# Patient Record
Sex: Female | Born: 1975 | Race: White | Hispanic: No | Marital: Married | Smoking: Never smoker
Health system: Southern US, Community
[De-identification: ages and names within clinical notes are randomized; demographics above are authoritative.]

## PROBLEM LIST (undated history)

## (undated) DIAGNOSIS — C4490 Unspecified malignant neoplasm of skin, unspecified: Secondary | ICD-10-CM

## (undated) DIAGNOSIS — T7840XA Allergy, unspecified, initial encounter: Secondary | ICD-10-CM

## (undated) DIAGNOSIS — N809 Endometriosis, unspecified: Secondary | ICD-10-CM

## (undated) DIAGNOSIS — J301 Allergic rhinitis due to pollen: Secondary | ICD-10-CM

## (undated) DIAGNOSIS — J45909 Unspecified asthma, uncomplicated: Secondary | ICD-10-CM

## (undated) DIAGNOSIS — F900 Attention-deficit hyperactivity disorder, predominantly inattentive type: Secondary | ICD-10-CM

## (undated) HISTORY — DX: Attention-deficit hyperactivity disorder, predominantly inattentive type: F90.0

## (undated) HISTORY — DX: Unspecified malignant neoplasm of skin, unspecified: C44.90

## (undated) HISTORY — DX: Endometriosis, unspecified: N80.9

## (undated) HISTORY — PX: OVARIAN CYST REMOVAL: SHX89

## (undated) HISTORY — DX: Allergic rhinitis due to pollen: J30.1

## (undated) HISTORY — PX: LAPAROSCOPIC OVARIAN CYSTECTOMY: SUR786

## (undated) HISTORY — DX: Allergy, unspecified, initial encounter: T78.40XA

---

## 1997-04-30 ENCOUNTER — Other Ambulatory Visit: Admission: RE | Admit: 1997-04-30 | Discharge: 1997-04-30 | Payer: Self-pay | Admitting: Obstetrics and Gynecology

## 1997-06-07 ENCOUNTER — Ambulatory Visit (HOSPITAL_COMMUNITY): Admission: RE | Admit: 1997-06-07 | Discharge: 1997-06-07 | Payer: Self-pay | Admitting: Obstetrics and Gynecology

## 1998-03-14 ENCOUNTER — Other Ambulatory Visit: Admission: RE | Admit: 1998-03-14 | Discharge: 1998-03-14 | Payer: Self-pay | Admitting: Obstetrics and Gynecology

## 1998-05-30 ENCOUNTER — Emergency Department (HOSPITAL_COMMUNITY): Admission: EM | Admit: 1998-05-30 | Discharge: 1998-05-30 | Payer: Self-pay | Admitting: Emergency Medicine

## 1999-06-14 ENCOUNTER — Inpatient Hospital Stay (HOSPITAL_COMMUNITY): Admission: AD | Admit: 1999-06-14 | Discharge: 1999-06-14 | Payer: Self-pay | Admitting: Obstetrics and Gynecology

## 1999-06-15 ENCOUNTER — Inpatient Hospital Stay (HOSPITAL_COMMUNITY): Admission: AD | Admit: 1999-06-15 | Discharge: 1999-06-15 | Payer: Self-pay | Admitting: Obstetrics and Gynecology

## 1999-06-15 ENCOUNTER — Inpatient Hospital Stay (HOSPITAL_COMMUNITY): Admission: AD | Admit: 1999-06-15 | Discharge: 1999-06-17 | Payer: Self-pay | Admitting: Obstetrics and Gynecology

## 1999-06-23 ENCOUNTER — Encounter: Admission: RE | Admit: 1999-06-23 | Discharge: 1999-09-21 | Payer: Self-pay | Admitting: Obstetrics and Gynecology

## 1999-07-21 ENCOUNTER — Other Ambulatory Visit: Admission: RE | Admit: 1999-07-21 | Discharge: 1999-07-21 | Payer: Self-pay | Admitting: Obstetrics and Gynecology

## 2000-08-22 ENCOUNTER — Other Ambulatory Visit: Admission: RE | Admit: 2000-08-22 | Discharge: 2000-08-22 | Payer: Self-pay | Admitting: Obstetrics and Gynecology

## 2001-09-05 ENCOUNTER — Other Ambulatory Visit: Admission: RE | Admit: 2001-09-05 | Discharge: 2001-09-05 | Payer: Self-pay | Admitting: Obstetrics and Gynecology

## 2002-09-12 ENCOUNTER — Encounter: Payer: Self-pay | Admitting: Obstetrics and Gynecology

## 2002-09-12 ENCOUNTER — Encounter: Admission: RE | Admit: 2002-09-12 | Discharge: 2002-09-12 | Payer: Self-pay | Admitting: Obstetrics and Gynecology

## 2002-09-12 ENCOUNTER — Other Ambulatory Visit: Admission: RE | Admit: 2002-09-12 | Discharge: 2002-09-12 | Payer: Self-pay | Admitting: Obstetrics and Gynecology

## 2003-03-22 ENCOUNTER — Emergency Department (HOSPITAL_COMMUNITY): Admission: EM | Admit: 2003-03-22 | Discharge: 2003-03-22 | Payer: Self-pay | Admitting: Family Medicine

## 2003-09-13 ENCOUNTER — Other Ambulatory Visit: Admission: RE | Admit: 2003-09-13 | Discharge: 2003-09-13 | Payer: Self-pay | Admitting: Obstetrics and Gynecology

## 2003-10-29 ENCOUNTER — Emergency Department (HOSPITAL_COMMUNITY): Admission: EM | Admit: 2003-10-29 | Discharge: 2003-10-30 | Payer: Self-pay | Admitting: *Deleted

## 2004-01-10 ENCOUNTER — Emergency Department (HOSPITAL_COMMUNITY): Admission: EM | Admit: 2004-01-10 | Discharge: 2004-01-10 | Payer: Self-pay | Admitting: Family Medicine

## 2004-01-16 ENCOUNTER — Ambulatory Visit: Payer: Self-pay | Admitting: Family Medicine

## 2004-09-07 ENCOUNTER — Other Ambulatory Visit: Admission: RE | Admit: 2004-09-07 | Discharge: 2004-09-07 | Payer: Self-pay | Admitting: Obstetrics and Gynecology

## 2005-04-05 ENCOUNTER — Ambulatory Visit (HOSPITAL_COMMUNITY): Admission: RE | Admit: 2005-04-05 | Discharge: 2005-04-05 | Payer: Self-pay | Admitting: Gastroenterology

## 2005-04-05 ENCOUNTER — Encounter (INDEPENDENT_AMBULATORY_CARE_PROVIDER_SITE_OTHER): Payer: Self-pay | Admitting: Specialist

## 2005-04-26 ENCOUNTER — Encounter: Admission: RE | Admit: 2005-04-26 | Discharge: 2005-04-26 | Payer: Self-pay | Admitting: Gastroenterology

## 2005-10-28 ENCOUNTER — Emergency Department (HOSPITAL_COMMUNITY): Admission: EM | Admit: 2005-10-28 | Discharge: 2005-10-28 | Payer: Self-pay | Admitting: Family Medicine

## 2006-05-10 ENCOUNTER — Ambulatory Visit: Payer: Self-pay | Admitting: Family Medicine

## 2006-05-10 LAB — CONVERTED CEMR LAB
ALT: 15 units/L (ref 0–40)
AST: 22 units/L (ref 0–37)
Albumin: 4.1 g/dL (ref 3.5–5.2)
Alkaline Phosphatase: 46 units/L (ref 39–117)
Amylase: 71 units/L (ref 27–131)
BUN: 12 mg/dL (ref 6–23)
Basophils Absolute: 0 10*3/uL (ref 0.0–0.1)
Basophils Relative: 0.4 % (ref 0.0–1.0)
Bilirubin, Direct: 0.1 mg/dL (ref 0.0–0.3)
CO2: 32 meq/L (ref 19–32)
Calcium: 9.8 mg/dL (ref 8.4–10.5)
Chloride: 106 meq/L (ref 96–112)
Creatinine, Ser: 0.7 mg/dL (ref 0.4–1.2)
Eosinophils Absolute: 0.1 10*3/uL (ref 0.0–0.6)
Eosinophils Relative: 1 % (ref 0.0–5.0)
Folate: 15.8 ng/mL
GFR calc Af Amer: 125 mL/min
GFR calc non Af Amer: 103 mL/min
Glucose, Bld: 87 mg/dL (ref 70–99)
HCT: 37.7 % (ref 36.0–46.0)
Hemoglobin: 12.8 g/dL (ref 12.0–15.0)
Lymphocytes Relative: 28.6 % (ref 12.0–46.0)
MCHC: 34 g/dL (ref 30.0–36.0)
MCV: 85.9 fL (ref 78.0–100.0)
Monocytes Absolute: 0.6 10*3/uL (ref 0.2–0.7)
Monocytes Relative: 11.2 % — ABNORMAL HIGH (ref 3.0–11.0)
Neutro Abs: 3.4 10*3/uL (ref 1.4–7.7)
Neutrophils Relative %: 58.8 % (ref 43.0–77.0)
Platelets: 247 10*3/uL (ref 150–400)
Potassium: 4.6 meq/L (ref 3.5–5.1)
RBC: 4.39 M/uL (ref 3.87–5.11)
RDW: 12.4 % (ref 11.5–14.6)
Sodium: 142 meq/L (ref 135–145)
TSH: 2.17 microintl units/mL (ref 0.35–5.50)
Total Bilirubin: 0.8 mg/dL (ref 0.3–1.2)
Total Protein: 7.4 g/dL (ref 6.0–8.3)
WBC: 5.7 10*3/uL (ref 4.5–10.5)

## 2009-01-11 DIAGNOSIS — L439 Lichen planus, unspecified: Secondary | ICD-10-CM

## 2009-01-11 HISTORY — DX: Lichen planus, unspecified: L43.9

## 2009-07-16 ENCOUNTER — Emergency Department (HOSPITAL_COMMUNITY): Admission: EM | Admit: 2009-07-16 | Discharge: 2009-07-16 | Payer: Self-pay | Admitting: Emergency Medicine

## 2010-05-29 NOTE — Op Note (Signed)
[name], Maureen Velazquez              ACCOUNT NO.:  [id number]   MEDICAL RECORD NO.:  [id number]          PATIENT TYPE:  AMB   LOCATION:  ENDO                         FACILITY:  [facility]   PHYSICIAN:  Anselmo Rod, M.D.  DATE OF BIRTH:  February 04, 1975   DATE OF PROCEDURE:  04/06/2005  DATE OF DISCHARGE:                                 OPERATIVE REPORT   PROCEDURE:  Colonoscopy with multiple cold biopsies.   ENDOSCOPIST:  Anselmo Rod, M.D.   INSTRUMENT USED:  Olympus video colonoscope.   INDICATIONS FOR PROCEDURE:  35 year old white female with a history of  change in bowel habits, worsening constipation, family history of colon  cancer in a maternal grandfather and maternal grandmother.  Rule out colonic  polyps, masses, etc.   PREPROCEDURE PREPARATION:  Informed consent was obtained from the patient.  The patient was fasted for four hours prior to the procedure and prepped  with Osmo prep pills the night of and the morning of the procedure.  The  risks and benefits of the procedure including a 10% miss rate of cancer and  polyps were discussed with the patient, as well.   PREPROCEDURE PHYSICAL:  Patient with stable vital signs.  Neck supple.  Chest clear to auscultation.  S1 and S2 regular.  Abdomen soft with normal  bowel sounds.   DESCRIPTION OF PROCEDURE:  The patient was placed in the left lateral  decubitus position, sedated with an additional 30 mcg of Fentanyl and 3 mg  Versed in slow incremental doses.  Once the patient was adequately sedated,  maintained on low flow oxygen and continuous cardiac monitoring, the Olympus  video colonoscope was advanced from the rectum to the cecum.  A prominent  fold was biopsied from the cecum.  No masses or polyps were seen.  The  terminal ileum appeared healthy without lesions.  Small internal hemorrhoids  were seen on retroflexion in the rectum.  The rest of the exam was  unremarkable.  The patient tolerated the procedure well  without  complications.   IMPRESSION:  1.  Small nonbleeding internal hemorrhoids.  2.  Prominent fold biopsy from the cecum.  3.  Otherwise, normal exam to the terminal ileum.   RECOMMENDATIONS:  1.  Await pathology results.  2.  Avoid all nonsteroidals for now.  3.  Outpatient follow up in the next two weeks for further recommendations.      Anselmo Rod, M.D.  Electronically Signed     JNM/MEDQ  D:  04/06/2005  T:  04/07/2005  Job:  [id number]   cc:   Marcelino Duster L. Vincente Poli, M.D.  Fax: [phone]

## 2010-05-29 NOTE — Assessment & Plan Note (Signed)
[facility] OFFICE NOTE   Maureen Velazquez, Maureen Velazquez                       MRN:          [id number]  DATE:05/10/2006                            DOB:          May 11, 1975    This is a 35 year old woman here to establish with our practice  complaining of an area of irritation and numbness in her back.  This  area is confined to a very specific area on the left side of the center  of her back.  It began bothering her about a year and a half ago for no  particular reason.  She has no history of rashes or anything in that  area that she knows of.  She does note an episode of shingles on her  right flank which resolved quickly about six years ago.  There has been  no history of trauma to the back.  She denies that it is actually  painful but says that it is a little irritated, sometimes has a burning  quality, and sometimes has a numbness to it with decreased sense of  touch.  She had been going to urgent care doctors and was referred to  see Dr. Lesia Sago for neurologic consult about four months ago.  He  did an MRI scan of part of her spine, which I assume was the thoracic  spine.  Apparently this was normal.  He tried her on nonsteroidal anti-  inflammatory medications for a while but none of these seemed to help.  The patient saw a chiropractor for a while and these manipulations did  not help either.  The pain is a constant irritation to her, although it  does not limit her daily activities.  She typically has flare-ups of  this problem every day but it may come and go at various times  throughout the day.  She does know there are certain ways she can arch  her back or twist from side-to-side where she feels a little twinge of  pain in the back or she may be able to start it bothering her when it  had not been bothering her before that during that day.  There is no  shortness of breath, cough, or fever.  She notes about  10 years ago she  fell off a horse and briefly experienced some numbness and weakness with  inability to move her legs.  She said after about 5 minute she regained  total use of her legs and has had no problems with the lower extremities  ever since.   OTHER PAST MEDICAL HISTORY:  She has had two vaginal deliveries.  Her  children are ages 92 and 6.  She sees Dr. Aaron Edelman for OB/GYN  care.  She is having some abdominal discomfort and underwent an upper  and lower endoscopy in March 2007 by Dr. Anselmo Rod.  The upper  endoscopy revealed a hiatal hernia and some reflux and the colonoscopy  was clear.  She had a heart murmur for a while as a baby but grew out of  it.  She  has some allergies.  She did have chicken pox as a child.  She  had some asthma as a child but it rarely bothers her now.  She had an  ovarian cystectomy in April 2007.   ALLERGIES:  None.   CURRENT MEDICATIONS:  Allegra D b.i.d. as needed.  Albuterol inhaler b.i.d. as needed.   HABITS:  She does not use tobacco.  She drinks some wine.   SOCIAL HISTORY:  She is married.  She is a Associate Professor.  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   FAMILY HISTORY:  Remarkable for colon cancer in two of her parents.   OBJECTIVE:  VITAL SIGNS:  Height 5 feet 0 inches.  Weight 118.  Blood  pressure 94/72.  Pulse 84 and regular.  Temperature 98.4. degrees.  IN GENERAL:  She is in no acute distress.  She gets up and down from the  examination table easily.  SPINE:  Shows no tenderness.  Her back itself shows no particular  tenderness.  No skin lesions are seen.  She has full range of motion of  the thoracic spine through flexion, extension, and lateral rotation.  NEUROLOGIC EXAM:  Otherwise, grossly intact.   ASSESSMENT AND PLAN:  Neuropathy of uncertain etiology.  We will try to  get Dr. Clarisa Kindred office notes as well as the results of the MRI scans  sent  to [place] that she recently had done.  Secondly, we will get some  screening laboratories today including a glucose, TSH, CBC, B12 level,  etc.  Lastly will begin Neurontin 100 mg b.i.d. and titrate it upwards  as needed.     Tera Mater. Clent Ridges, MD  Electronically Signed    SAF/MedQ  DD: 05/10/2006  DT: 05/10/2006  Job #: [id number]

## 2010-05-29 NOTE — Op Note (Signed)
NAMECHEZNEY, Maureen Velazquez              ACCOUNT NO.:  [id number]   MEDICAL RECORD NO.:  [id number]          PATIENT TYPE:  AMB   LOCATION:  ENDO                         FACILITY:  [facility]   PHYSICIAN:  Anselmo Rod, M.D.  DATE OF BIRTH:  03-19-1975   DATE OF PROCEDURE:  04/05/2005  DATE OF DISCHARGE:                                 OPERATIVE REPORT   PROCEDURE:  Esophagogastroduodenoscopy with gastric biopsy.   ENDOSCOPIST:  Anselmo Rod, M.D.   INSTRUMENT USED:  Olympus video panendoscope.   INDICATIONS FOR PROCEDURE:  35 year old white female with a history of  epigastric pain, rule out peptic ulcer disease, esophagitis, gastritis, etc.   PREPROCEDURE PREPARATION:  Informed consent was obtained from the patient.  The patient was fasted for four hours prior to the procedure.  The risks and  benefits of the procedure were discussed with the patient in great detail.   PREPROCEDURE PHYSICAL:  Patient with stable vital signs.  Neck supple.  Chest clear to auscultation.  S1 and S2 regular.  Abdomen soft with normal  bowel sounds.   DESCRIPTION OF PROCEDURE:  The patient was placed in the left lateral  decubitus position, sedated with 70 mcg of fentanyl and 7 mg Versed in slow  incremental doses.  Once the patient was adequately sedated, maintained on  low flow oxygen and continuous cardiac monitoring, the Olympus video  panendoscope was advanced through the mouth piece over the tongue into the  esophagus under direct vision.  The entire esophagus appeared normal with no  evidence of ring, strictures, masses, esophagitis, or Barrett's mucosa.  The  scope was then advanced into the stomach.  A small hiatal hernia was seen on  high retroflexion and there was mild diffuse gastritis, gastric biopsy was  done to rule out the presence of H. pylori by pathology.  The proximal small  bowel appeared normal.  No ulcers, erosions, masses, or polyps were seen.   IMPRESSION:  1.  Normal  appearing esophagus.  2.  Small hiatal hernia seen on retroflexion.  3.  Mild, diffuse gastritis with biopsies done for H. pylori, no ulcers,      erosions, masses, or polyps were seen.  4.  Normal proximal small bowel.   RECOMMENDATIONS:  1.  Continue PPIs.  2.  Avoid all nonsteroidals for now.  3.  Await pathology results.  4.  Treat with antibiotics if H. pylori present on biopsy.  5.  Proceed with colonoscopy at this time, further recommendations will be      made after this.      Anselmo Rod, M.D.  Electronically Signed     JNM/MEDQ  D:  04/06/2005  T:  04/07/2005  Job:  [id number]   cc:   Marcelino Duster L. Vincente Poli, M.D.  Fax: [phone]

## 2011-11-29 ENCOUNTER — Other Ambulatory Visit: Payer: Self-pay | Admitting: Obstetrics and Gynecology

## 2012-08-28 ENCOUNTER — Ambulatory Visit (INDEPENDENT_AMBULATORY_CARE_PROVIDER_SITE_OTHER): Payer: 59 | Admitting: Emergency Medicine

## 2012-08-28 VITALS — BP 98/60 | HR 81 | Temp 98.2°F | Resp 18 | Ht 61.5 in | Wt 118.0 lb

## 2012-08-28 DIAGNOSIS — F988 Other specified behavioral and emotional disorders with onset usually occurring in childhood and adolescence: Secondary | ICD-10-CM

## 2012-08-28 MED ORDER — LISDEXAMFETAMINE DIMESYLATE 30 MG PO CAPS: 30.0000 mg | ORAL_CAPSULE | ORAL | Status: DC

## 2012-08-28 NOTE — Patient Instructions (Addendum)
Attention Deficit Hyperactivity Disorder Attention deficit hyperactivity disorder (ADHD) is a problem with behavior issues based on the way the brain functions (neurobehavioral disorder). It is a common reason for behavior and academic problems in school. CAUSES  The cause of ADHD is unknown in most cases. It may run in families. It sometimes can be associated with learning disabilities and other behavioral problems. SYMPTOMS  There are 3 types of ADHD. The 3 types and some of the symptoms include:  Inattentive  Gets bored or distracted easily.  Loses or forgets things. Forgets to hand in homework.  Has trouble organizing or completing tasks.  Difficulty staying on task.  An inability to organize daily tasks and school work.  Leaving projects, chores, or homework unfinished.  Trouble paying attention or responding to details. Careless mistakes.  Difficulty following directions. Often seems like is not listening.  Dislikes activities that require sustained attention (like chores or homework).  Hyperactive-impulsive  Feels like it is impossible to sit still or stay in a seat. Fidgeting with hands and feet.  Trouble waiting turn.  Talking too much or out of turn. Interruptive.  Speaks or acts impulsively.  Aggressive, disruptive behavior.  Constantly busy or on the go, noisy.  Combined  Has symptoms of both of the above. Often children with ADHD feel discouraged about themselves and with school. They often perform well below their abilities in school. These symptoms can cause problems in home, school, and in relationships with peers. As children get older, the excess motor activities can calm down, but the problems with paying attention and staying organized persist. Most children do not outgrow ADHD but with good treatment can learn to cope with the symptoms. DIAGNOSIS  When ADHD is suspected, the diagnosis should be made by professionals trained in ADHD.  Diagnosis will  include:  Ruling out other reasons for the child's behavior.  The caregivers will check with the child's school and check their medical records.  They will talk to teachers and parents.  Behavior rating scales for the child will be filled out by those dealing with the child on a daily basis. A diagnosis is made only after all information has been considered. TREATMENT  Treatment usually includes behavioral treatment often along with medicines. It may include stimulant medicines. The stimulant medicines decrease impulsivity and hyperactivity and increase attention. Other medicines used include antidepressants and certain blood pressure medicines. Most experts agree that treatment for ADHD should address all aspects of the child's functioning. Treatment should not be limited to the use of medicines alone. Treatment should include structured classroom management. The parents must receive education to address rewarding good behavior, discipline, and limit-setting. Tutoring or behavioral therapy or both should be available for the child. If untreated, the disorder can have long-term serious effects into adolescence and adulthood. HOME CARE INSTRUCTIONS   Often with ADHD there is a lot of frustration among the family in dealing with the illness. There is often blame and anger that is not warranted. This is a life long illness. There is no way to prevent ADHD. In many cases, because the problem affects the family as a whole, the entire family may need help. A therapist can help the family find better ways to handle the disruptive behaviors and promote change. If the child is young, most of the therapist's work is with the parents. Parents will learn techniques for coping with and improving their child's behavior. Sometimes only the child with the ADHD needs counseling. Your caregivers can help   you make these decisions.  Children with ADHD may need help in organizing. Some helpful tips include:  Keep  routines the same every day from wake-up time to bedtime. Schedule everything. This includes homework and playtime. This should include outdoor and indoor recreation. Keep the schedule on the refrigerator or a bulletin board where it is frequently seen. Mark schedule changes as far in advance as possible.  Have a place for everything and keep everything in its place. This includes clothing, backpacks, and school supplies.  Encourage writing down assignments and bringing home needed books.  Offer your child a well-balanced diet. Breakfast is especially important for school performance. Children should avoid drinks with caffeine including:  Soft drinks.  Coffee.  Tea.  However, some older children (adolescents) may find these drinks helpful in improving their attention.  Children with ADHD need consistent rules that they can understand and follow. If rules are followed, give small rewards. Children with ADHD often receive, and expect, criticism. Look for good behavior and praise it. Set realistic goals. Give clear instructions. Look for activities that can foster success and self-esteem. Make time for pleasant activities with your child. Give lots of affection.  Parents are their children's greatest advocates. Learn as much as possible about ADHD. This helps you become a stronger and better advocate for your child. It also helps you educate your child's teachers and instructors if they feel inadequate in these areas. Parent support groups are often helpful. A national group with local chapters is called CHADD (Children and Adults with Attention Deficit Hyperactivity Disorder). PROGNOSIS  There is no cure for ADHD. Children with the disorder seldom outgrow it. Many find adaptive ways to accommodate the ADHD as they mature. SEEK MEDICAL CARE IF:  Your child has repeated muscle twitches, cough or speech outbursts.  Your child has sleep problems.  Your child has a marked loss of  appetite.  Your child develops depression.  Your child has new or worsening behavioral problems.  Your child develops dizziness.  Your child has a racing heart.  Your child has stomach pains.  Your child develops headaches. Document Released: 12/18/2001 Document Revised: 03/22/2011 Document Reviewed: 07/31/2007 ExitCare Patient Information 2014 [employer].  

## 2012-08-28 NOTE — Progress Notes (Signed)
[facility] [address], [place] [address] [phone]  Date:  08/28/2012   Name:  Maureen Velazquez   DOB:  Aug 03, 1975   MRN:  [id number]  PCP:  No primary provider on file.    Chief Complaint: discuss medications   History of Present Illness:  Maureen Velazquez is a 37 y.o. very pleasant female patient who presents with the following:  Noticed that she had the same issues that led to her children's treatment with ADD medication for the past month, she decided to do a trial of her son's medication on herself and found that she was more focused and "able to calm down" a little bit.  Works for her Public relations account executive business.  Tolerates the medication but for som mild sleep induction issues.  Weight and appetite are stable.  Brought a letter from her therapist who recommends she be continued on medication.  She has never been worked up for ADD.  No improvement with over the counter medications or other home remedies. Denies other complaint or health concern today.    There are no active problems to display for this patient.   Past Medical History  Diagnosis Date  . Allergy     Past Surgical History  Procedure Laterality Date  . Laparoscopic ovarian cystectomy      History  Substance Use Topics  . Smoking status: Never Smoker   . Smokeless tobacco: Not on file  . Alcohol Use: No    Family History  Problem Relation Age of Onset  . Cancer Maternal Grandmother   . Cancer Maternal Grandfather     No Known Allergies  Medication list has been reviewed and updated.  No current outpatient prescriptions on file prior to visit.   No current facility-administered medications on file prior to visit.    Review of Systems:  As per HPI, otherwise negative.    Physical Examination: Filed Vitals:   08/28/12 1123  BP: 98/60  Pulse: 81  Temp: 98.2 F (36.8 C)  Resp: 18   Filed Vitals:   08/28/12 1123  Height: 5' 1.5" (1.562 m)  Weight: 118  lb (53.524 kg)   Body mass index is 21.94 kg/(m^2). Ideal Body Weight: Weight in (lb) to have BMI = 25: 134.2  GEN: WDWN, NAD, Non-toxic, A & O x 3 HEENT: Atraumatic, Normocephalic. Neck supple. No masses, No LAD. Ears and Nose: No external deformity. CV: RRR, No M/G/R. No JVD. No thrill. No extra heart sounds. PULM: CTA B, no wheezes, crackles, rhonchi. No retractions. No resp. distress. No accessory muscle use. ABD: S, NT, ND, +BS. No rebound. No HSM. EXTR: No c/c/e NEURO Normal gait.  PSYCH: Normally interactive. Conversant. Not depressed or anxious appearing.  Calm demeanor.    Assessment and Plan: ADD Continue vyvanse Follow up with Dr Merla Riches    Signed,  Phillips Odor, MD

## 2012-09-22 ENCOUNTER — Encounter: Payer: Self-pay | Admitting: Family Medicine

## 2012-09-22 ENCOUNTER — Ambulatory Visit (INDEPENDENT_AMBULATORY_CARE_PROVIDER_SITE_OTHER): Payer: BC Managed Care – PPO | Admitting: Family Medicine

## 2012-09-22 VITALS — BP 98/68 | HR 64 | Temp 98.0°F | Resp 16 | Ht 61.0 in | Wt 117.0 lb

## 2012-09-22 DIAGNOSIS — F988 Other specified behavioral and emotional disorders with onset usually occurring in childhood and adolescence: Secondary | ICD-10-CM

## 2012-09-22 MED ORDER — LISDEXAMFETAMINE DIMESYLATE 30 MG PO CAPS: 30.0000 mg | ORAL_CAPSULE | ORAL | Status: DC

## 2012-09-22 NOTE — Patient Instructions (Addendum)
You need to get formal ADD/ADHD testing before I will continue you on stimulant therapy.  There are many places this can be done such as:   [facility] [address] [place] 981 [phone]   [facility] [address] [place]. [place] 200 [phone]  [facility] [address] [phone]  Kellie Moor  [place] [address] [place] [phone]  [facility] [address] [phone]  [facility]  It may be worth going to a location that has a PSYCHIATRIST - a MD (medial doctor) that can then prescribe your ADD stimulant therapy though if you are unable to do this, we can discuss prescribing your medications here.  For instance: [facility]  [address]  [address]  [phone] I think [facility] also has a psychiatrist.  Alternatively, there are several different ADD treatments that do NOT involve stimulants which are safer to sustain long-term.  I would be happy to discuss these with you should you be interested.

## 2012-09-22 NOTE — Progress Notes (Signed)
Subjective:    Patient ID: Maureen Velazquez, female    DOB: 04-21-1975, 37 y.o.   MRN: [id number] Chief Complaint  Patient presents with  . Medication Refill    vyvanse   HPI  As kids have gone through the process of ADD testing at [facility] she realized that she identified with a lot of the questions and concerns raised during their diagnosis. Her kids are 53 yo [place] and 64 yo Sheria Lang - Charlotte Sanes was diagnosed at 68 yo and brother diagnosed 86 yo. 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  Savanah tried adderall for less than a month but didn't do well on it and cameron hasn't tried Vyvanse yet though has a prescription for it so currently dealing with kids ADD through behavioral modification rather than medications.  Started on Vyvanse 2 months ago by Dr. Dareen Piano - see note. First mo was extremely tired in the mornings and has to exercise less - less than 1/2 of what she was doing prior for exercise and HAs which have gone away.  A.m. Fatigue is leveled out but not resolved. Feels like muscle fatigue.  Not falling asleep as well but then sleeping ok after eventually falling asleep.  Takes the medication around 7-8 a.m. and around 5-6 p.m. can feel the medicine wearing off again and gets tired again but usually by 9 p.m. is doing well.  Is taking the vyvanse every single day and has never tried any other med moods in the past - poss short-term lexapro during her divorce from her gyn but was not on it long enough to be therapeutic - "stopped after 1-2 months because it wasn't working". Gynecologist is [facility].  She will have her annual well woman exam in the next sev weeks. Reports that she often has her routine blood work done along with this and will have a copy forwarded to [place]. Therapist is Karmen Bongo at [facility] since divorce but more around children's issues.  Started on therapy after her divorce - the children's father will have nothing to do with them so  they occ see the therapist as well.  Past Medical History  Diagnosis Date  . Allergy    Current Outpatient Prescriptions on File Prior to Visit  Medication Sig Dispense Refill  . lisdexamfetamine (VYVANSE) 30 MG capsule Take 1 capsule (30 mg total) by mouth every morning. DO not fill prior to 10/28/12  30 capsule  0   No current facility-administered medications on file prior to visit.   No Known Allergies  Review of Systems  Constitutional: Positive for activity change and fatigue. Negative for fever, chills, diaphoresis, appetite change and unexpected weight change.  Cardiovascular: Negative for chest pain.  Gastrointestinal: Negative for nausea and vomiting.  Musculoskeletal: Positive for myalgias.  Psychiatric/Behavioral: Positive for sleep disturbance and decreased concentration. Negative for behavioral problems, confusion, dysphoric mood and agitation. The patient is not nervous/anxious and is not hyperactive.       BP 98/68  Pulse 64  Temp(Src) 98 F (36.7 C) (Oral)  Resp 16  Ht 5\' 1"  (1.549 m)  Wt 117 lb (53.071 kg)  BMI 22.12 kg/m2  SpO2 100%  LMP 09/18/2012 Objective:   Physical Exam  Constitutional: She is oriented to person, place, and time. She appears well-developed and well-nourished. No distress.  HENT:  Head: Normocephalic and atraumatic.  Right Ear: External ear normal.  Left Ear: External ear normal.  Eyes: Conjunctivae are normal. No scleral icterus.  Neck:  Normal range of motion. Neck supple. No thyromegaly present.  Cardiovascular: Normal rate, regular rhythm, normal heart sounds and intact distal pulses.   Pulmonary/Chest: Effort normal and breath sounds normal. No respiratory distress.  Musculoskeletal: She exhibits no edema.  Lymphadenopathy:    She has no cervical adenopathy.  Neurological: She is alert and oriented to person, place, and time.  Skin: Skin is warm and dry. She is not diaphoretic. No erythema.  Psychiatric: She has a normal mood  and affect. Her behavior is normal.      Assessment & Plan:  ADD (attention deficit disorder) Has rx from Dr. Dareen Piano from 8/18 and then second rx states do not fill till on or after 10/18 to gave 1 rx for this month - till fill on 9/18.  Info given for formal ADD testing which she will need to obtain before she is due for refills on 11/15 and will need to have OV at that time to review results and discuss long-term medication options.  Will need to be seen every 3 months if on stimulant therapy but recommend cons psychiatry visits for meds. Meds ordered this encounter  Medications  . lisdexamfetamine (VYVANSE) 30 MG capsule    Sig: Take 1 capsule (30 mg total) by mouth every morning.    Dispense:  30 capsule    Refill:  0   Have gynecologist send up copy of annual visit and labs.

## 2012-10-30 ENCOUNTER — Telehealth: Payer: Self-pay

## 2012-10-30 NOTE — Telephone Encounter (Signed)
Pt called and LM on VM to ask if her Rx for Vyvanse could be reduced from 30 mg to 20 mg for this month. Called pt to get details. She has seen [facility] as planned who suggested she try the 20 mg dose to see if she gets the benefits from the med w/fewer SEs of muscle fatigue. Pt is still in the middle of testing for ADD and goes back to see Marissa Calamity on 11/27/12. The hard copy of this Oct's Rx is on hold at [facility] where we would have to cancel it. Dr Clelia Croft, do you want to write a new script for the 20 mg for pt to try and have me cancel the 30 mg on hold at pharmacy? Pt is going out of town in the morning so she needs to p/up today.

## 2012-10-31 MED ORDER — LISDEXAMFETAMINE DIMESYLATE 20 MG PO CAPS: 20.0000 mg | ORAL_CAPSULE | ORAL | Status: DC

## 2012-10-31 NOTE — Telephone Encounter (Signed)
Patient has already gotten the 30 mg dose. She will decrease to 20 mg in November Rx at front desk. Patient advised to follow up in Dec

## 2012-10-31 NOTE — Telephone Encounter (Signed)
Went ahead and printed a rx for the 20mg  dose.  If she has already filled her 30mg  dose than she can use it next month to fill on or after 11/28/12. Then she will need to have f/u for review of psych testing before any additional rxs will be written.

## 2012-10-31 NOTE — Telephone Encounter (Signed)
Yes, fine to switch from 30mg  qd to 20mg  qd.  Looks like rx was written to be filled on 10/18 (actually written by Dr. Dareen Piano who had also seen this pt).  However, since I couldn't get back to pt on the same day she called, not sure if she still wants to try this or not still.  If so either someone else can write it or I will be in the office 10/22 to do so.

## 2012-12-13 ENCOUNTER — Other Ambulatory Visit: Payer: Self-pay | Admitting: Obstetrics and Gynecology

## 2012-12-25 ENCOUNTER — Ambulatory Visit (INDEPENDENT_AMBULATORY_CARE_PROVIDER_SITE_OTHER): Payer: BC Managed Care – PPO | Admitting: Family Medicine

## 2012-12-25 VITALS — BP 104/66 | HR 68 | Temp 98.3°F | Resp 16 | Ht 61.5 in | Wt 116.8 lb

## 2012-12-25 DIAGNOSIS — F988 Other specified behavioral and emotional disorders with onset usually occurring in childhood and adolescence: Secondary | ICD-10-CM

## 2012-12-25 MED ORDER — LISDEXAMFETAMINE DIMESYLATE 20 MG PO CAPS: 20.0000 mg | ORAL_CAPSULE | ORAL | Status: DC

## 2012-12-25 NOTE — Patient Instructions (Signed)
Atomoxetine capsules What is this medicine? ATOMOXETINE (AT oh mox e teen) is used to treat attention deficit/hyperactivity disorder, also known as ADHD. It is not a stimulant like other drugs for ADHD. This drug can improve attention span, concentration, and emotional control. It can also reduce restless or overactive behavior. This medicine may be used for other purposes; ask your health care provider or pharmacist if you have questions. COMMON BRAND NAME(S): Strattera What should I tell my health care provider before I take this medicine? They need to know if you have any of these conditions: -glaucoma -high or low blood pressure -history of stroke -irregular heartbeat or other cardiac disease -liver disease -mania or bipolar disorder -pheochromocytoma -suicidal thoughts -an unusual or allergic reaction to atomoxetine, other medicines, foods, dyes, or preservatives -pregnant or trying to get pregnant -breast-feeding How should I use this medicine? Take this medicine by mouth with a glass of water. Follow the directions on the prescription label. You can take it with or without food. If it upsets your stomach, take it with food. If you have difficulty sleeping and you take more than 1 dose per day, take your last dose before 6 PM. Take your medicine at regular intervals. Do not take it more often than directed. Do not stop taking except on your doctor's advice. A special MedGuide will be given to you by the pharmacist with each prescription and refill. Be sure to read this information carefully each time. Talk to your pediatrician regarding the use of this medicine in children. While this drug may be prescribed for children as young as 6 years for selected conditions, precautions do apply. Overdosage: If you think you have taken too much of this medicine contact a poison control center or emergency room at once. NOTE: This medicine is only for you. Do not share this medicine with  others. What if I miss a dose? If you miss a dose, take it as soon as you can. If it is almost time for your next dose, take only that dose. Do not take double or extra doses. What may interact with this medicine? Do not take this medicine with any of the following medications: -medicines called MAO Inhibitors like Nardil, Parnate, Marplan, Eldepryl -methylphenidate or dexmethylphenidate -reboxetine This medicine may also interact with the following medications: -amphetamines -atropine -breathing treatments, like albuterol, formoterol or salmeterol -certain heart medicines, like amiodarone or quinidine -ephedra, Ma huang or ephedrine -medicines for depression, anxiety or other mood problems -medicines for weight loss -medicines that increase blood pressure like ephedrine This list may not describe all possible interactions. Give your health care provider a list of all the medicines, herbs, non-prescription drugs, or dietary supplements you use. Also tell them if you smoke, drink alcohol, or use illegal drugs. Some items may interact with your medicine. What should I watch for while using this medicine? It may take a week or more for this medicine to take effect. This is why it is very important to continue taking the medicine and not miss any doses. If you have been taking this medicine regularly for some time, do not suddenly stop taking it. Ask your doctor or health care professional for advice. Rarely, this medicine may increase thoughts of suicide or suicide attempts in children and teenagers. Call your child's health care professional right away if your child or teenager has new or increased thoughts of suicide or has changes in mood or behavior like becoming irritable or anxious. Regularly monitor your child for these  behavioral changes. For males, contact you doctor or health care professional right away if you have an erection that lasts longer than 4 hours or if it becomes painful. This  may be a sign of serious problem and must be treated right away to prevent permanent damage. You may get drowsy or dizzy. Do not drive, use machinery, or do anything that needs mental alertness until you know how this medicine affects you. Do not stand or sit up quickly, especially if you are an older patient. This reduces the risk of dizzy or fainting spells. Alcohol can make you more drowsy and dizzy. Avoid alcoholic drinks. Do not treat yourself for coughs, colds or allergies without asking your doctor or health care professional for advice. Some ingredients can increase possible side effects. Your mouth may get dry. Chewing sugarless gum or sucking hard candy, and drinking plenty of water will help. What side effects may I notice from receiving this medicine? Side effects that you should report to your doctor or health care professional as soon as possible: -allergic reactions like skin rash, itching or hives, swelling of the face, lips, or tongue -breathing problems -chest pain -dark urine -fast, irregular heartbeat -general ill feeling or flu-like symptoms -high blood pressure -males: prolonged or painful erection -stomach pain or tenderness -trouble passing urine or change in the amount of urine -vomiting -weight loss -yellowing of the eyes or skin Side effects that usually do not require medical attention (report to your doctor or health care professional if they continue or are bothersome): -change in sex drive or performance -constipation or diarrhea -headache -loss of appetite -menstrual period irregularities -nausea -stomach upset This list may not describe all possible side effects. Call your doctor for medical advice about side effects. You may report side effects to FDA at 1-800-FDA-1088. Where should I keep my medicine? Keep out of the reach of children. Store at room temperature between 15 and 30 degrees C (59 and 86 degrees F). Throw away any unused medication after  the expiration date. NOTE: This sheet is a summary. It may not cover all possible information. If you have questions about this medicine, talk to your doctor, pharmacist, or health care provider.  2014, Elsevier/Gold Standard. (2011-12-29 08:11:25) Attention Deficit Hyperactivity Disorder Attention deficit hyperactivity disorder (ADHD) is a problem with behavior issues based on the way the brain functions (neurobehavioral disorder). It is a common reason for behavior and academic problems in school. CAUSES  The cause of ADHD is unknown in most cases. It may run in families. It sometimes can be associated with learning disabilities and other behavioral problems. SYMPTOMS  There are 3 types of ADHD. The 3 types and some of the symptoms include:  Inattentive  Gets bored or distracted easily.  Loses or forgets things. Forgets to hand in homework.  Has trouble organizing or completing tasks.  Difficulty staying on task.  An inability to organize daily tasks and school work.  Leaving projects, chores, or homework unfinished.  Trouble paying attention or responding to details. Careless mistakes.  Difficulty following directions. Often seems like is not listening.  Dislikes activities that require sustained attention (like chores or homework).  Hyperactive-impulsive  Feels like it is impossible to sit still or stay in a seat. Fidgeting with hands and feet.  Trouble waiting turn.  Talking too much or out of turn. Interruptive.  Speaks or acts impulsively.  Aggressive, disruptive behavior.  Constantly busy or on the go, noisy.  Combined  Has symptoms of both  of the above. Often children with ADHD feel discouraged about themselves and with school. They often perform well below their abilities in school. These symptoms can cause problems in home, school, and in relationships with peers. As children get older, the excess motor activities can calm down, but the problems with paying  attention and staying organized persist. Most children do not outgrow ADHD but with good treatment can learn to cope with the symptoms. DIAGNOSIS  When ADHD is suspected, the diagnosis should be made by professionals trained in ADHD.  Diagnosis will include:  Ruling out other reasons for the child's behavior.  The caregivers will check with the child's school and check their medical records.  They will talk to teachers and parents.  Behavior rating scales for the child will be filled out by those dealing with the child on a daily basis. A diagnosis is made only after all information has been considered. TREATMENT  Treatment usually includes behavioral treatment often along with medicines. It may include stimulant medicines. The stimulant medicines decrease impulsivity and hyperactivity and increase attention. Other medicines used include antidepressants and certain blood pressure medicines. Most experts agree that treatment for ADHD should address all aspects of the child's functioning. Treatment should not be limited to the use of medicines alone. Treatment should include structured classroom management. The parents must receive education to address rewarding good behavior, discipline, and limit-setting. Tutoring or behavioral therapy or both should be available for the child. If untreated, the disorder can have long-term serious effects into adolescence and adulthood. HOME CARE INSTRUCTIONS   Often with ADHD there is a lot of frustration among the family in dealing with the illness. There is often blame and anger that is not warranted. This is a life long illness. There is no way to prevent ADHD. In many cases, because the problem affects the family as a whole, the entire family may need help. A therapist can help the family find better ways to handle the disruptive behaviors and promote change. If the child is young, most of the therapist's work is with the parents. Parents will learn techniques  for coping with and improving their child's behavior. Sometimes only the child with the ADHD needs counseling. Your caregivers can help you make these decisions.  Children with ADHD may need help in organizing. Some helpful tips include:  Keep routines the same every day from wake-up time to bedtime. Schedule everything. This includes homework and playtime. This should include outdoor and indoor recreation. Keep the schedule on the refrigerator or a bulletin board where it is frequently seen. Mark schedule changes as far in advance as possible.  Have a place for everything and keep everything in its place. This includes clothing, backpacks, and school supplies.  Encourage writing down assignments and bringing home needed books.  Offer your child a well-balanced diet. Breakfast is especially important for school performance. Children should avoid drinks with caffeine including:  Soft drinks.  Coffee.  Tea.  However, some older children (adolescents) may find these drinks helpful in improving their attention.  Children with ADHD need consistent rules that they can understand and follow. If rules are followed, give small rewards. Children with ADHD often receive, and expect, criticism. Look for good behavior and praise it. Set realistic goals. Give clear instructions. Look for activities that can foster success and self-esteem. Make time for pleasant activities with your child. Give lots of affection.  Parents are their children's greatest advocates. Learn as much as possible about ADHD. This  helps you become a stronger and better advocate for your child. It also helps you educate your child's teachers and instructors if they feel inadequate in these areas. Parent support groups are often helpful. A national group with local chapters is called CHADD (Children and Adults with Attention Deficit Hyperactivity Disorder). PROGNOSIS  There is no cure for ADHD. Children with the disorder seldom outgrow  it. Many find adaptive ways to accommodate the ADHD as they mature. SEEK MEDICAL CARE IF:  Your child has repeated muscle twitches, cough or speech outbursts.  Your child has sleep problems.  Your child has a marked loss of appetite.  Your child develops depression.  Your child has new or worsening behavioral problems.  Your child develops dizziness.  Your child has a racing heart.  Your child has stomach pains.  Your child develops headaches. Document Released: 12/18/2001 Document Revised: 03/22/2011 Document Reviewed: 07/19/2012 [facility] Patient Information 2014 [place], [place].

## 2012-12-25 NOTE — Progress Notes (Signed)
This chart was scribed for Norberto Sorenson, MD by Joaquin Music, ED Scribe. This patient was seen in room Room/bed 12 and the patient's care was started at 9:29 AM. Subjective:    Patient ID: Maureen Velazquez, female    DOB: Dec 31, 1975, 37 y.o.   MRN: [id number] Chief Complaint  Patient presents with  . Discuss Medication    Vyvanse   HPI Maureen Velazquez is a 37 y.o. female who presents to the [facility] for refills on her vyvanse. Pt states she has been doing well with the decreased dose of vyvanse from 30 to 20. She has not really noticed any change in her attention/concentration abilities w/ this dose decrease. Her myalgias and muscle aches are mostly relieved. She has been seeing Marissa Calamity for ADD testing which is going well but not yet complete. She did bring OV notes from Ms. Dew which have been scanned into Epic.  Ms. Wyn Quaker encouraged Maureen Velazquez to consider non-stimulant ADD therapy such as Strattera which she would like to consider. 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 Her 16-y.o daughter is starting a early college program and will have some home study w/ this as well. Pt states she has recently started looking into alternative or herbal products for her ADD - looking into something called Dottera (?sp) but has not had much time to research it thoroughly yet.  She does not want to be on daily medication but can't skip a day of vyvanse or else she just feels incredibly fatigued.    Past Medical History  Diagnosis Date  . Allergy    Current outpatient prescriptions:lisdexamfetamine (VYVANSE) 20 MG capsule, Take 1 capsule (20 mg total) by mouth every morning. DO not fill prior to 10/28/12, Disp: 30 capsule, Rfl: 0;  norethindrone-ethinyl estradiol-iron (LOESTRIN FE 1.5/30) 1.5-30 MG-MCG tablet, Take 1 tablet by mouth daily., Disp: , Rfl: ;  lisdexamfetamine (VYVANSE) 30 MG capsule, Take 1  capsule (30 mg total) by mouth every morning., Disp: 30 capsule, Rfl: 0  No Known Allergies  Review of Systems  Constitutional: Positive for fatigue. Negative for fever, chills, activity change, appetite change and unexpected weight change.  Cardiovascular: Negative for chest pain, palpitations and leg swelling.  Gastrointestinal: Negative for nausea, vomiting and diarrhea.  Musculoskeletal: Negative for arthralgias, gait problem and myalgias.  Psychiatric/Behavioral: Negative for suicidal ideas, sleep disturbance and agitation. The patient is hyperactive. The patient is not nervous/anxious.     Triage Vitals:BP 104/66  Pulse 68  Temp(Src) 98.3 F (36.8 C) (Oral)  Resp 16  Ht 5' 1.5" (1.562 m)  Wt 116 lb 12.8 oz (52.98 kg)  BMI 21.71 kg/m2  SpO2 100%  LMP 12/12/2012 Objective:   Physical Exam  Nursing note and vitals reviewed. Constitutional: She is oriented to person, place, and time. She appears well-developed and well-nourished. No distress.  HENT:  Head: Normocephalic and atraumatic.  Eyes: Pupils are equal, round, and reactive to light.  Neck: Normal range of motion. Neck supple. No tracheal deviation present. No thyromegaly present.  Cardiovascular: Normal rate, regular rhythm and normal heart sounds.  Exam reveals no gallop and no friction rub.   No murmur heard. Pulmonary/Chest: Effort normal and breath sounds normal. No respiratory distress. She has no wheezes. She has no rales.  Musculoskeletal: Normal range of motion.  Lymphadenopathy:    She has no cervical adenopathy.  Neurological: She  is alert and oriented to person, place, and time.  Skin: Skin is warm and dry.  Psychiatric: She has a normal mood and affect. Her behavior is normal.   Assessment & Plan:   ADD (attention deficit disorder) Pt interested in transitioning to Strattera - non-stimulant ADD therapy. This was suggested to her by her counsellor Marissa Calamity who pt is currently seeing for ADD testing. I  think this is an excellent idea. 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 Concerned about going off of vyvanse cold-turkey, esp w/ potential for 2-3 months needed to wean up on strattera till effective - need to discuss possibility of splitting capsules of vyvanse (would this destroy its ER?) vs augmenting w/ very low dose IR adderrall during initial strattera. Meds ordered this encounter  Medications  . norethindrone-ethinyl estradiol-iron (LOESTRIN FE 1.5/30) 1.5-30 MG-MCG tablet    Sig: Take 1 tablet by mouth daily.  Marland Kitchen lisdexamfetamine (VYVANSE) 20 MG capsule    Sig: Take 1 capsule (20 mg total) by mouth every morning.    Dispense:  30 capsule    Refill:  0  . lisdexamfetamine (VYVANSE) 20 MG capsule    Sig: Take 1 capsule (20 mg total) by mouth every morning. DO not fill prior to 01/25/13    Dispense:  30 capsule    Refill:  0    I personally performed the services described in this documentation, which was scribed in my presence. The recorded information has been reviewed and considered, and addended by me as needed.  Norberto Sorenson, MD MPH

## 2013-02-02 ENCOUNTER — Ambulatory Visit (INDEPENDENT_AMBULATORY_CARE_PROVIDER_SITE_OTHER): Payer: BC Managed Care – PPO | Admitting: Family Medicine

## 2013-02-02 VITALS — BP 104/70 | HR 81 | Temp 98.3°F | Resp 16 | Ht 61.5 in | Wt 118.0 lb

## 2013-02-02 DIAGNOSIS — F988 Other specified behavioral and emotional disorders with onset usually occurring in childhood and adolescence: Secondary | ICD-10-CM

## 2013-02-02 NOTE — Progress Notes (Signed)
Subjective:    Patient ID: Maureen Velazquez, female    DOB: [birth year], 38 y.o.   MRN: [id number]  Chief Complaint  Patient presents with  . Follow-up    medications   This chart was scribed for Shawnee Knapp, MD by Zettie Pho, ED Scribe.   HPI Maureen Velazquez is a 38 y.o. female who presents to [facility] for a follow up for her prescription for Vyvanse. She states that she has been halving her usual 20 mg dosage and only taking 10 mg daily, which has been working for her. She states has had some more fatigue than usual with the lower dosage, but states she has been acclimating well to it and still plans to continue to wean herself completely off of the Vyvanse. She states she has considered switching to Strattera, but is still unsure when and if she wants to make the change. Patient states she is still seeing a therapist regularly [name]).   Patient is also inquiring about adrenal fatigue, which she states a holistic practitioner told her she may have.   Past Medical History  Diagnosis Date  . Allergy    Current Outpatient Prescriptions on File Prior to Visit  Medication Sig Dispense Refill  . lisdexamfetamine (VYVANSE) 20 MG capsule Take 1 capsule (20 mg total) by mouth every morning.  30 capsule  0  . lisdexamfetamine (VYVANSE) 20 MG capsule Take 1 capsule (20 mg total) by mouth every morning. DO not fill prior to 01/25/13  30 capsule  0  . norethindrone-ethinyl estradiol-iron (LOESTRIN FE 1.5/30) 1.5-30 MG-MCG tablet Take 1 tablet by mouth daily.       No current facility-administered medications on file prior to visit.   No Known Allergies  Review of Systems  Constitutional: Positive for fatigue (mild). Negative for fever, chills and unexpected weight change.  Respiratory: Negative for cough.   Gastrointestinal: Negative for vomiting, abdominal pain, diarrhea and constipation.  Skin: Negative for rash.  Neurological: Negative for dizziness and  seizures.      Vitals: BP 104/70  Pulse 81  Temp(Src) 98.3 F (36.8 C)  Resp 16  Ht 5' 1.5" (1.562 m)  Wt 118 lb (53.524 kg)  BMI 21.94 kg/m2  SpO2 100%  Objective:   Physical Exam  Nursing note and vitals reviewed. Constitutional: She is oriented to person, place, and time. She appears well-developed and well-nourished. No distress.  HENT:  Head: Normocephalic and atraumatic.  Eyes: Conjunctivae are normal.  Neck: Normal range of motion. Neck supple.  Pulmonary/Chest: Effort normal. No respiratory distress.  Abdominal: She exhibits no distension.  Musculoskeletal: Normal range of motion.  Neurological: She is alert and oriented to person, place, and time.  Skin: Skin is warm and dry.  Psychiatric: She has a normal mood and affect. Her behavior is normal.      Assessment & Plan:  ADD (attention deficit disorder)  10:00 AM- [facility] reviewed and indicated that no controlled substances were dispensed from any other provider than myself and she had her prescriptions of 20 mg Vyvanse filled on 11/30/2012 and 12/25/2012.   Pt plans to cont on vyvanse 10mg  and gradually cont to wean back then stop. 10:20 AM- Advised patient to keep track of her daily activities/to-do to ensure the lower dosage of Vynvase is not negatively affecting her productivity. Discussed the differences of Vyvanse vs. Strattera in terms of how the function and their subsequent effects. Advised patient to call the office if  she decides to switch to Strattera and discussed how the dosage will gradually increase based on her particular needs. Patient also inquired about Wellbutrin and discussed this medication as well.  If in the future she would like to start on Strattera she can just call the office - I will call her in 20mg  pills and rec take 1/2 tab daily x 2 wks, then 1 tab daily x 2 wks. Then if she would like, can increase to 2 tabs daily x 2 wks and let us know how she is doing. Usual treatment dose if 80mg  but  pt feels that she usually responds well to lower doses so might just want to try staying at 20 or 40mg  qd.  Reassured patient that the adrenal fatigue when coming off the vyvanse is no cause for concern at this time as she has been on such a low dose and she has been on the med for such a short period of time.   Discussed treatment plan with patient at bedside and patient verbalized agreement.   I personally performed the services described in this documentation, which was scribed in my presence. The recorded information has been reviewed and considered, and addended by me as needed.  Delman Cheadle, MD MPH

## 2014-01-15 ENCOUNTER — Other Ambulatory Visit: Payer: Self-pay | Admitting: Obstetrics and Gynecology

## 2014-01-16 LAB — CYTOLOGY - PAP

## 2015-01-14 LAB — HM MAMMOGRAPHY: HM Mammogram: NORMAL (ref 0–4)

## 2015-03-25 LAB — HM COLONOSCOPY: HM Colonoscopy: NORMAL

## 2015-04-15 ENCOUNTER — Encounter: Payer: Self-pay | Admitting: Internal Medicine

## 2015-04-15 ENCOUNTER — Ambulatory Visit (INDEPENDENT_AMBULATORY_CARE_PROVIDER_SITE_OTHER): Payer: Commercial Managed Care - HMO | Admitting: Internal Medicine

## 2015-04-15 VITALS — BP 104/70 | HR 86 | Temp 97.8°F | Ht 61.0 in | Wt 118.5 lb

## 2015-04-15 DIAGNOSIS — Z23 Encounter for immunization: Secondary | ICD-10-CM | POA: Diagnosis not present

## 2015-04-15 DIAGNOSIS — Z0000 Encounter for general adult medical examination without abnormal findings: Secondary | ICD-10-CM | POA: Diagnosis not present

## 2015-04-15 NOTE — Addendum Note (Signed)
Addended by: Pilar Grammes on: 04/15/2015 12:59 PM   Modules accepted: Orders

## 2015-04-15 NOTE — Progress Notes (Signed)
Pre visit review using our clinic review tool, if applicable. No additional management support is needed unless otherwise documented below in the visit note. 

## 2015-04-15 NOTE — Assessment & Plan Note (Signed)
Healthy Keeps up with gyn Good with fitness, etc Has had colonoscopies already Tdap today

## 2015-04-15 NOTE — Progress Notes (Signed)
Subjective:    Patient ID: Maureen Velazquez, female    DOB: [birth year], 40 y.o.   MRN: [id number]  HPI Here to establish care Has used [facility] prn Sees Dr Helane Rima  No medications Past OCP for endometriosis --but off for 1.5 years Diagnosed with ADHD--vyvanse in the past but not on this now  No current outpatient prescriptions on file prior to visit.   No current facility-administered medications on file prior to visit.    No Known Allergies  Past Medical History  Diagnosis Date  . Allergy   . Endometriosis   . ADHD (attention deficit hyperactivity disorder), inattentive type     Past Surgical History  Procedure Laterality Date  . Laparoscopic ovarian cystectomy    . Ovarian cyst removal Right ~2007    Family History  Problem Relation Age of Onset  . Cancer Maternal Grandmother     colon   . Cancer Maternal Grandfather     colon  . Heart disease Paternal Grandfather   . Diabetes Neg Hx     Social History   Social History  . Marital Status: Married    Spouse Name: N/A  . Number of Children: 2  . Years of Education: N/A   Occupational History  . 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 Smoking status: Never Smoker   . Smokeless tobacco: Not on file  . Alcohol Use: 0.0 oz/week    0 Standard drinks or equivalent per week     Comment: occ wine  . Drug Use: No  . Sexual Activity: Not on file   Other Topics Concern  . Not on file   Social History Narrative   Review of Systems  Constitutional: Negative for fatigue and unexpected weight change.       Exercises regularly Wears seat belt  HENT: Negative for hearing loss.        Rare tinnitus Keeps up with dentist  Eyes: Negative for visual disturbance.       No diplopia or unilateral vision loss  Respiratory: Negative for cough, chest tightness and shortness of breath.   Cardiovascular: Positive for leg swelling. Negative for chest pain and palpitations.         ?slight fluid in legs  Gastrointestinal: Negative for nausea, vomiting and constipation.       IBS as child--- tends to constipation. Uses OTC meds prn Started colonoscopies years ago due to [place]. Last negative by Dr Collene Mares last month  Endocrine: Negative for polydipsia and polyuria.  Genitourinary: Negative for dysuria, hematuria and dyspareunia.       Periods still regular Husband with vasectomy  Musculoskeletal: Positive for back pain.       Back and arms ache at times due to work---uses massage therapy prn Rare ibuprofen  Skin: Negative for rash.       No suspicious lesions Sees dermatologist yearly  Allergic/Immunologic: Positive for environmental allergies. Negative for immunocompromised state.       Claritin D helps  Neurological: Negative for dizziness, syncope, weakness and headaches.  Hematological: Negative for adenopathy. Does not bruise/bleed easily.  Psychiatric/Behavioral: Negative for sleep disturbance and dysphoric mood. The patient is not nervous/anxious.        Objective:   Physical Exam  Constitutional: She is oriented to person, place, and time. She appears well-nourished. No distress.  HENT:  Head: Normocephalic and atraumatic.  Right Ear: External ear normal.  Left Ear: External ear  normal.  Mouth/Throat: Oropharynx is clear and moist. No oropharyngeal exudate.  Eyes: Conjunctivae are normal. Pupils are equal, round, and reactive to light.  Neck: Normal range of motion. Neck supple. No thyromegaly present.  Cardiovascular: Normal rate, regular rhythm, normal heart sounds and intact distal pulses.  Exam reveals no gallop.   No murmur heard. Pulmonary/Chest: Effort normal and breath sounds normal. No respiratory distress. She has no wheezes. She has no rales.  Abdominal: Soft. There is no tenderness.  Musculoskeletal: She exhibits no edema or tenderness.  Lymphadenopathy:    She has no cervical adenopathy.  Neurological: She is alert and oriented to  person, place, and time.  Skin: No rash noted. No erythema.  Psychiatric: She has a normal mood and affect. Her behavior is normal.          Assessment & Plan:

## 2015-10-07 ENCOUNTER — Ambulatory Visit (INDEPENDENT_AMBULATORY_CARE_PROVIDER_SITE_OTHER): Payer: Commercial Managed Care - HMO | Admitting: Internal Medicine

## 2015-10-07 ENCOUNTER — Encounter: Payer: Self-pay | Admitting: Internal Medicine

## 2015-10-07 DIAGNOSIS — A09 Infectious gastroenteritis and colitis, unspecified: Secondary | ICD-10-CM

## 2015-10-07 NOTE — Assessment & Plan Note (Signed)
Seems better now No reason to do any testing Headache may be from mild dehydration still Counseled No action needed

## 2015-10-07 NOTE — Progress Notes (Signed)
   Subjective:    Patient ID: Maureen Velazquez, female    DOB: 05-03-1975, 40 y.o.   MRN: [id number]  HPI Here for some problems after trip to [place]  Went there 9/16-- [place] Staying at the [employer] at [employer] and other restaurants--some fruits and vegetables (undercooked meat also) Diarrhea started 9/21-- frequent and loose (up to 4-5 per day) Eventually just water This finally did stop 9/24--and none since then No blood Nauseated but no vomiting Still doesn't feel right  Headache not typical for her Not pounding---just dull and general No vision loss Some dizziness Did try to keep up with fluids  No current outpatient prescriptions on file prior to visit.   No current facility-administered medications on file prior to visit.     No Known Allergies  Past Medical History:  Diagnosis Date  . ADHD (attention deficit hyperactivity disorder), inattentive type   . Allergic rhinitis due to pollen   . Allergy   . Endometriosis     Past Surgical History:  Procedure Laterality Date  . LAPAROSCOPIC OVARIAN CYSTECTOMY    . OVARIAN CYST REMOVAL Right ~2007    Family History  Problem Relation Age of Onset  . Cancer Maternal Grandmother     colon   . Cancer Maternal Grandfather     colon  . Heart disease Paternal Grandfather   . Stroke Paternal Grandfather   . Parkinson's disease Paternal Grandfather   . Diabetes Neg Hx   . Hyperlipidemia Mother   . Melanoma Father   . Hyperlipidemia Paternal Grandmother     Social History   Social History  . Marital status: Married    Spouse name: N/A  . Number of children: 2  . Years of education: N/A   Occupational History  . 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 Smoking status: Never Smoker  . Smokeless tobacco: Not on file  . Alcohol use 0.0 oz/week     Comment: occ wine  . Drug use: No  . Sexual activity: Not on file   Other Topics Concern  . Not on file   Social  History Narrative  . No narrative on file   Review of Systems Appetite somewhat decreased--okay now No fever but did awaken warm at night No chills--just some sweats at night (was hot) Hasn't had to miss any work    Objective:   Physical Exam  Constitutional: She is oriented to person, place, and time. She appears well-developed and well-nourished. No distress.  Pulmonary/Chest: Effort normal and breath sounds normal. No respiratory distress. She has no wheezes. She has no rales.  Abdominal: Soft. She exhibits no distension. There is no tenderness. There is no rebound and no guarding.  Neurological: She is alert and oriented to person, place, and time. She has normal strength. No cranial nerve deficit. She exhibits normal muscle tone. Coordination and gait normal.  Psychiatric: She has a normal mood and affect. Her behavior is normal.          Assessment & Plan:

## 2015-10-07 NOTE — Progress Notes (Signed)
Pre visit review using our clinic review tool, if applicable. No additional management support is needed unless otherwise documented below in the visit note. 

## 2016-01-19 DIAGNOSIS — L570 Actinic keratosis: Secondary | ICD-10-CM | POA: Diagnosis not present

## 2016-01-19 DIAGNOSIS — L821 Other seborrheic keratosis: Secondary | ICD-10-CM | POA: Diagnosis not present

## 2016-01-19 DIAGNOSIS — D225 Melanocytic nevi of trunk: Secondary | ICD-10-CM | POA: Diagnosis not present

## 2016-01-19 DIAGNOSIS — L814 Other melanin hyperpigmentation: Secondary | ICD-10-CM | POA: Diagnosis not present

## 2016-02-09 DIAGNOSIS — Z01419 Encounter for gynecological examination (general) (routine) without abnormal findings: Secondary | ICD-10-CM | POA: Diagnosis not present

## 2016-02-09 DIAGNOSIS — Z6822 Body mass index (BMI) 22.0-22.9, adult: Secondary | ICD-10-CM | POA: Diagnosis not present

## 2016-02-10 ENCOUNTER — Other Ambulatory Visit: Payer: Self-pay | Admitting: Obstetrics and Gynecology

## 2016-02-10 DIAGNOSIS — R928 Other abnormal and inconclusive findings on diagnostic imaging of breast: Secondary | ICD-10-CM

## 2016-02-11 ENCOUNTER — Ambulatory Visit
Admission: RE | Admit: 2016-02-11 | Discharge: 2016-02-11 | Disposition: A | Discharge status: Home / Self Care | Payer: Commercial Managed Care - HMO | Source: Ambulatory Visit | Attending: Obstetrics and Gynecology | Admitting: Obstetrics and Gynecology

## 2016-02-11 DIAGNOSIS — R928 Other abnormal and inconclusive findings on diagnostic imaging of breast: Secondary | ICD-10-CM

## 2016-02-11 DIAGNOSIS — N6489 Other specified disorders of breast: Secondary | ICD-10-CM | POA: Diagnosis not present

## 2016-04-24 DIAGNOSIS — M542 Cervicalgia: Secondary | ICD-10-CM | POA: Diagnosis not present

## 2016-04-24 DIAGNOSIS — M546 Pain in thoracic spine: Secondary | ICD-10-CM | POA: Diagnosis not present

## 2016-05-19 ENCOUNTER — Encounter: Payer: Self-pay | Admitting: Internal Medicine

## 2016-05-19 ENCOUNTER — Encounter (INDEPENDENT_AMBULATORY_CARE_PROVIDER_SITE_OTHER): Payer: Self-pay

## 2016-05-19 ENCOUNTER — Ambulatory Visit (INDEPENDENT_AMBULATORY_CARE_PROVIDER_SITE_OTHER): Payer: Commercial Managed Care - HMO | Admitting: Internal Medicine

## 2016-05-19 VITALS — BP 100/66 | HR 80 | Temp 98.0°F | Wt 119.0 lb

## 2016-05-19 DIAGNOSIS — H6982 Other specified disorders of Eustachian tube, left ear: Secondary | ICD-10-CM | POA: Diagnosis not present

## 2016-05-19 DIAGNOSIS — R05 Cough: Secondary | ICD-10-CM | POA: Diagnosis not present

## 2016-05-19 DIAGNOSIS — R059 Cough, unspecified: Secondary | ICD-10-CM

## 2016-05-19 NOTE — Progress Notes (Signed)
Subjective:    Patient ID: Maureen Velazquez, female    DOB: Oct 07, 1975, 41 y.o.   MRN: [id number]  HPI  Pt presents to the clinic today with c/o left ear pain and cough. This started 1 week ago. She describes the left ear pain as sharp and stabbing. She does report decreased hearing, like she is in a tunnell. The cough is non productive. She denies chest pain or shortness of breath. She denies fever, chills or body aches. She has tried Claritin D and Nyquil with minimal relief. She has a history of allergies. She has not had sick contacts that he is aware of.   Review of Systems      Past Medical History:  Diagnosis Date  . ADHD (attention deficit hyperactivity disorder), inattentive type   . Allergic rhinitis due to pollen   . Allergy   . Endometriosis     No current outpatient prescriptions on file.   No current facility-administered medications for this visit.     No Known Allergies  Family History  Problem Relation Age of Onset  . Cancer Maternal Grandmother     colon   . Cancer Maternal Grandfather     colon  . Heart disease Paternal Grandfather   . Stroke Paternal Grandfather   . Parkinson's disease Paternal Grandfather   . Diabetes Neg Hx   . Hyperlipidemia Mother   . Melanoma Father   . Hyperlipidemia Paternal Grandmother     Social History   Social History  . Marital status: Married    Spouse name: N/A  . Number of children: 2  . Years of education: N/A   Occupational History  . 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 Smoking status: Never Smoker  . Smokeless tobacco: Never Used  . Alcohol use 0.0 oz/week     Comment: occ wine  . Drug use: No  . Sexual activity: Not on file   Other Topics Concern  . Not on file   Social History Narrative  . No narrative on file     Constitutional: Denies fever, malaise, fatigue, headache or abrupt weight changes.  HEENT: Pt reports left ear pain. Denies eye pain,  eye redness, ringing in the ears, wax buildup, runny nose, nasal congestion, bloody nose, or sore throat. Respiratory: Pt reports cough. Denies difficulty breathing, shortness of breath, or sputum production.     No other specific complaints in a complete review of systems (except as listed in HPI above).  Objective:   Physical Exam  BP 100/66   Pulse 80   Temp 98 F (36.7 C) (Oral)   Wt 119 lb (54 kg)   SpO2 98%   BMI 22.48 kg/m  Wt Readings from Last 3 Encounters:  05/19/16 119 lb (54 kg)  10/07/15 117 lb (53.1 kg)  04/15/15 118 lb 8 oz (53.8 kg)    General: Appears her stated age, in NAD. HEENT: Head: normal shape and size, no sinus tenderness noted; Right Ears: Tm's gray and intact, normal light reflex; Left Ear: Tm's red but intact, normal light reflux, + serous effusion noted. Throat/Mouth: Teeth present, mucosa pink and moist, + PND, no exudate, lesions or ulcerations noted.  Neck:  No adenopathy noted. Cardiovascular: Normal rate and rhythm.  Pulmonary/Chest: Normal effort and positive vesicular breath sounds. No respiratory distress. No wheezes, rales or ronchi noted.    BMET    Component Value Date/Time   NA  142 05/10/2006 1650   K 4.6 05/10/2006 1650   CL 106 05/10/2006 1650   CO2 32 05/10/2006 1650   GLUCOSE 87 05/10/2006 1650   BUN 12 05/10/2006 1650   CREATININE 0.7 05/10/2006 1650   CALCIUM 9.8 05/10/2006 1650   GFRNONAA 103 05/10/2006 1650   GFRAA 125 05/10/2006 1650    Lipid Panel  No results found for: CHOL, TRIG, HDL, CHOLHDL, VLDL, LDLCALC  CBC    Component Value Date/Time   WBC 5.7 05/10/2006 1650   RBC 4.39 05/10/2006 1650   HGB 12.8 05/10/2006 1650   HCT 37.7 05/10/2006 1650   PLT 247 05/10/2006 1650   MCV 85.9 05/10/2006 1650   MCHC 34.0 05/10/2006 1650   RDW 12.4 05/10/2006 1650   MONOABS 0.6 05/10/2006 1650   EOSABS 0.1 05/10/2006 1650   BASOSABS 0.0 05/10/2006 1650    Hgb A1C No results found for: HGBA1C            Assessment & Plan:   ETD, Left:  Start Flonase OTC BID x 3 days then daily thereafter If that is not effective, will try Prednisone Ibuprofen may be helpful  Cough:  No need for abx Continue Claritin for now Can use cough drops OTC  RTC as needed or if symptoms persist or worsen Maureen Fuerte, NP

## 2016-05-19 NOTE — Patient Instructions (Signed)
Earache, Adult An earache, or ear pain, can be caused by many things, including:  An infection.  Ear wax buildup.  Ear pressure.  Something in the ear that should not be there (foreign body).  A sore throat.  Tooth problems.  Jaw problems. Treatment of the earache will depend on the cause. If the cause is not clear or cannot be determined, you may need to watch your symptoms until your earache goes away or until a cause is found. Follow these instructions at home: Pay attention to any changes in your symptoms. Take these actions to help with your pain:  Take or apply over-the-counter and prescription medicines only as told by your health care provider.  If you were prescribed an antibiotic medicine, use it as told by your health care provider. Do not stop using the antibiotic even if you start to feel better.  Do not put anything in your ear other than medicine that is prescribed by your health care provider.  If directed, apply heat to the affected area as often as told by your health care provider. Use the heat source that your health care provider recommends, such as a moist heat pack or a heating pad.  Place a towel between your skin and the heat source.  Leave the heat on for 20-30 minutes.  Remove the heat if your skin turns bright red. This is especially important if you are unable to feel pain, heat, or cold. You may have a greater risk of getting burned.  If directed, put ice on the ear:  Put ice in a plastic bag.  Place a towel between your skin and the bag.  Leave the ice on for 20 minutes, 2-3 times a day.  Try resting in an upright position instead of lying down. This may help to reduce pressure in your ear and relieve pain.  Chew gum if it helps to relieve your ear pain.  Treat any allergies as told by your health care provider.  Keep all follow-up visits as told by your health care provider. This is important. Contact a health care provider if:  Your  pain does not improve within 2 days.  Your earache gets worse.  You have new symptoms.  You have a fever. Get help right away if:  You have a severe headache.  You have a stiff neck.  You have trouble swallowing.  You have redness or swelling behind your ear.  You have fluid or blood coming from your ear.  You have hearing loss.  You feel dizzy. This information is not intended to replace advice given to you by your health care provider. Make sure you discuss any questions you have with your health care provider. Document Released: 08/15/2003 Document Revised: 08/26/2015 Document Reviewed: 06/23/2015 Elsevier Interactive Patient Education  2017 [employer]  

## 2016-06-02 DIAGNOSIS — L603 Nail dystrophy: Secondary | ICD-10-CM | POA: Diagnosis not present

## 2016-06-02 DIAGNOSIS — L820 Inflamed seborrheic keratosis: Secondary | ICD-10-CM | POA: Diagnosis not present

## 2016-08-17 DIAGNOSIS — M62838 Other muscle spasm: Secondary | ICD-10-CM | POA: Diagnosis not present

## 2016-08-17 DIAGNOSIS — M546 Pain in thoracic spine: Secondary | ICD-10-CM | POA: Diagnosis not present

## 2016-08-17 DIAGNOSIS — M9902 Segmental and somatic dysfunction of thoracic region: Secondary | ICD-10-CM | POA: Diagnosis not present

## 2016-08-19 DIAGNOSIS — M62838 Other muscle spasm: Secondary | ICD-10-CM | POA: Diagnosis not present

## 2016-08-19 DIAGNOSIS — M546 Pain in thoracic spine: Secondary | ICD-10-CM | POA: Diagnosis not present

## 2016-08-19 DIAGNOSIS — M9902 Segmental and somatic dysfunction of thoracic region: Secondary | ICD-10-CM | POA: Diagnosis not present

## 2016-11-08 DIAGNOSIS — M546 Pain in thoracic spine: Secondary | ICD-10-CM | POA: Diagnosis not present

## 2016-11-08 DIAGNOSIS — M9901 Segmental and somatic dysfunction of cervical region: Secondary | ICD-10-CM | POA: Diagnosis not present

## 2016-11-08 DIAGNOSIS — M542 Cervicalgia: Secondary | ICD-10-CM | POA: Diagnosis not present

## 2017-02-15 ENCOUNTER — Other Ambulatory Visit: Payer: Self-pay | Admitting: Obstetrics and Gynecology

## 2017-02-15 DIAGNOSIS — Z6822 Body mass index (BMI) 22.0-22.9, adult: Secondary | ICD-10-CM | POA: Diagnosis not present

## 2017-02-15 DIAGNOSIS — Z86010 Personal history of colonic polyps: Secondary | ICD-10-CM | POA: Diagnosis not present

## 2017-02-15 DIAGNOSIS — N644 Mastodynia: Secondary | ICD-10-CM

## 2017-02-15 DIAGNOSIS — Z808 Family history of malignant neoplasm of other organs or systems: Secondary | ICD-10-CM | POA: Diagnosis not present

## 2017-02-15 DIAGNOSIS — Z800 Family history of malignant neoplasm of digestive organs: Secondary | ICD-10-CM | POA: Diagnosis not present

## 2017-02-15 DIAGNOSIS — Z01419 Encounter for gynecological examination (general) (routine) without abnormal findings: Secondary | ICD-10-CM | POA: Diagnosis not present

## 2017-02-16 ENCOUNTER — Ambulatory Visit
Admission: RE | Admit: 2017-02-16 | Discharge: 2017-02-16 | Disposition: A | Discharge status: Home / Self Care | Payer: Commercial Managed Care - HMO | Source: Ambulatory Visit | Attending: Obstetrics and Gynecology | Admitting: Obstetrics and Gynecology

## 2017-02-16 ENCOUNTER — Ambulatory Visit
Admission: RE | Admit: 2017-02-16 | Discharge: 2017-02-16 | Disposition: A | Discharge status: Home / Self Care | Payer: 59 | Source: Ambulatory Visit | Attending: Obstetrics and Gynecology | Admitting: Obstetrics and Gynecology

## 2017-02-16 DIAGNOSIS — N644 Mastodynia: Secondary | ICD-10-CM

## 2017-02-16 DIAGNOSIS — R922 Inconclusive mammogram: Secondary | ICD-10-CM | POA: Diagnosis not present

## 2017-04-06 DIAGNOSIS — H1045 Other chronic allergic conjunctivitis: Secondary | ICD-10-CM | POA: Diagnosis not present

## 2017-04-07 DIAGNOSIS — Z809 Family history of malignant neoplasm, unspecified: Secondary | ICD-10-CM | POA: Diagnosis not present

## 2017-05-26 ENCOUNTER — Ambulatory Visit: Payer: 59 | Admitting: Family Medicine

## 2017-05-26 ENCOUNTER — Encounter: Payer: Self-pay | Admitting: Family Medicine

## 2017-05-26 ENCOUNTER — Encounter: Payer: Self-pay | Admitting: Psychology

## 2017-05-26 DIAGNOSIS — L438 Other lichen planus: Secondary | ICD-10-CM | POA: Insufficient documentation

## 2017-05-26 MED ORDER — PREDNISONE 20 MG PO TABS: ORAL_TABLET | ORAL | 0 refills | Status: DC

## 2017-05-26 NOTE — Assessment & Plan Note (Signed)
Treat with oral prednsione taper.. If not improving follow up with [facility]. Make sure to have 6 month to 12 month evals for risk of squamous cell CA orally with dentist otherwise.

## 2017-05-26 NOTE — Progress Notes (Signed)
   Subjective:    Patient ID: Maureen Velazquez, female    DOB: 10-24-1975, 42 y.o.   MRN: [id number]  HPI    42 year old female presents for flare of  mouth lesions.  Roof of mouth, open sores in last 4 days, worse in last  1 days.  Headache, teeth ache.cervical.   no fever, no ST, No N/V, no SOB, no dysphagia, no odonyphagia.   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 Dr. Esperanza [employer].  Showed chronic mucositis with lichenoid features.  She had not had frequent flare in last 6 years until now.   Blood pressure 96/60, pulse 78, height 5\' 1"  (1.549 m), weight 117 lb 4 oz (53.2 kg), last menstrual period 05/22/2017, SpO2 98 %.    Review of Systems  Constitutional: Negative for fatigue and fever.  HENT: Negative for congestion.   Eyes: Negative for pain.  Respiratory: Negative for cough and shortness of breath.   Cardiovascular: Negative for chest pain, palpitations and leg swelling.  Gastrointestinal: Negative for abdominal pain.  Genitourinary: Negative for dysuria and vaginal bleeding.  Musculoskeletal: Negative for back pain.  Neurological: Negative for syncope, light-headedness and headaches.  Psychiatric/Behavioral: Negative for dysphoric mood.       Objective:   Physical Exam  HENT:  Mouth/Throat: Oropharynx is clear and moist and mucous membranes are normal. Oral lesions present.     reticular white ulcers x several with erthematous halo  Lymphadenopathy:    She has cervical adenopathy.       Right cervical: Superficial cervical adenopathy present.       Left cervical: Superficial cervical adenopathy present.          Assessment & Plan:

## 2017-05-26 NOTE — Patient Instructions (Signed)
reat with oral prednsione taper.. If not improving follow up with [facility]. Make sure to have 6 month to 12 month evals for risk of squamous cell CA orally with dentist otherwise.

## 2017-07-04 ENCOUNTER — Other Ambulatory Visit: Payer: Self-pay

## 2017-07-04 DIAGNOSIS — C44719 Basal cell carcinoma of skin of left lower limb, including hip: Secondary | ICD-10-CM | POA: Diagnosis not present

## 2017-07-04 DIAGNOSIS — M9901 Segmental and somatic dysfunction of cervical region: Secondary | ICD-10-CM | POA: Diagnosis not present

## 2017-07-04 DIAGNOSIS — D485 Neoplasm of uncertain behavior of skin: Secondary | ICD-10-CM | POA: Diagnosis not present

## 2017-07-04 DIAGNOSIS — D0359 Melanoma in situ of other part of trunk: Secondary | ICD-10-CM | POA: Diagnosis not present

## 2017-07-04 DIAGNOSIS — M546 Pain in thoracic spine: Secondary | ICD-10-CM | POA: Diagnosis not present

## 2017-07-04 DIAGNOSIS — M542 Cervicalgia: Secondary | ICD-10-CM | POA: Diagnosis not present

## 2017-07-04 DIAGNOSIS — R208 Other disturbances of skin sensation: Secondary | ICD-10-CM | POA: Diagnosis not present

## 2017-07-04 DIAGNOSIS — L821 Other seborrheic keratosis: Secondary | ICD-10-CM | POA: Diagnosis not present

## 2017-07-04 DIAGNOSIS — C44729 Squamous cell carcinoma of skin of left lower limb, including hip: Secondary | ICD-10-CM | POA: Diagnosis not present

## 2017-07-04 DIAGNOSIS — D229 Melanocytic nevi, unspecified: Secondary | ICD-10-CM | POA: Diagnosis not present

## 2017-07-04 DIAGNOSIS — B070 Plantar wart: Secondary | ICD-10-CM | POA: Diagnosis not present

## 2017-07-04 DIAGNOSIS — L814 Other melanin hyperpigmentation: Secondary | ICD-10-CM | POA: Diagnosis not present

## 2017-07-11 HISTORY — PX: MELANOMA EXCISION: SHX5266

## 2017-07-13 DIAGNOSIS — L989 Disorder of the skin and subcutaneous tissue, unspecified: Secondary | ICD-10-CM | POA: Diagnosis not present

## 2017-07-13 DIAGNOSIS — C4359 Malignant melanoma of other part of trunk: Secondary | ICD-10-CM | POA: Diagnosis not present

## 2017-07-21 ENCOUNTER — Encounter: Payer: Self-pay | Admitting: Internal Medicine

## 2017-07-22 DIAGNOSIS — L905 Scar conditions and fibrosis of skin: Secondary | ICD-10-CM | POA: Diagnosis not present

## 2017-07-22 DIAGNOSIS — C44729 Squamous cell carcinoma of skin of left lower limb, including hip: Secondary | ICD-10-CM | POA: Diagnosis not present

## 2017-09-01 DIAGNOSIS — M542 Cervicalgia: Secondary | ICD-10-CM | POA: Diagnosis not present

## 2017-09-01 DIAGNOSIS — M9901 Segmental and somatic dysfunction of cervical region: Secondary | ICD-10-CM | POA: Diagnosis not present

## 2017-09-01 DIAGNOSIS — M546 Pain in thoracic spine: Secondary | ICD-10-CM | POA: Diagnosis not present

## 2017-09-07 DIAGNOSIS — L821 Other seborrheic keratosis: Secondary | ICD-10-CM | POA: Diagnosis not present

## 2017-09-07 DIAGNOSIS — D229 Melanocytic nevi, unspecified: Secondary | ICD-10-CM | POA: Diagnosis not present

## 2017-09-07 DIAGNOSIS — L814 Other melanin hyperpigmentation: Secondary | ICD-10-CM | POA: Diagnosis not present

## 2018-04-15 IMAGING — MG 2D DIGITAL DIAGNOSTIC BILATERAL MAMMOGRAM WITH CAD AND ADJUNCT T
8 of 12 series · 8 of 28 positions shown · non-contrast
Comparison: Previous exam(s).

CLINICAL DATA: Patient describes breast pain (burning sensation) at
the 8 o'clock axis of the left breast.

EXAM:
2D DIGITAL DIAGNOSTIC BILATERAL MAMMOGRAM WITH CAD AND ADJUNCT TOMO
ULTRASOUND LEFT BREAST

[L MLO]
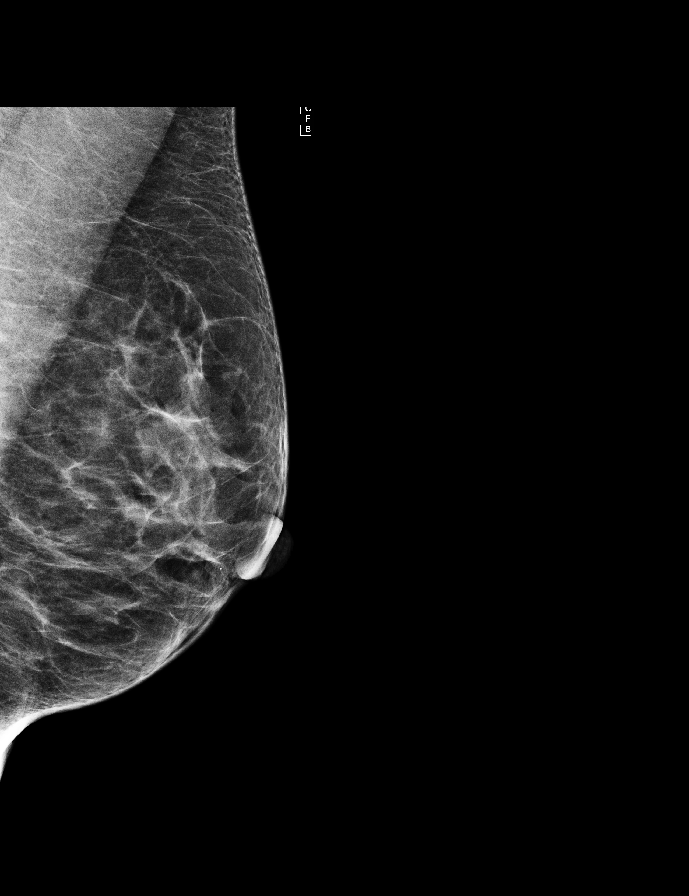

[R CC synth-2D]
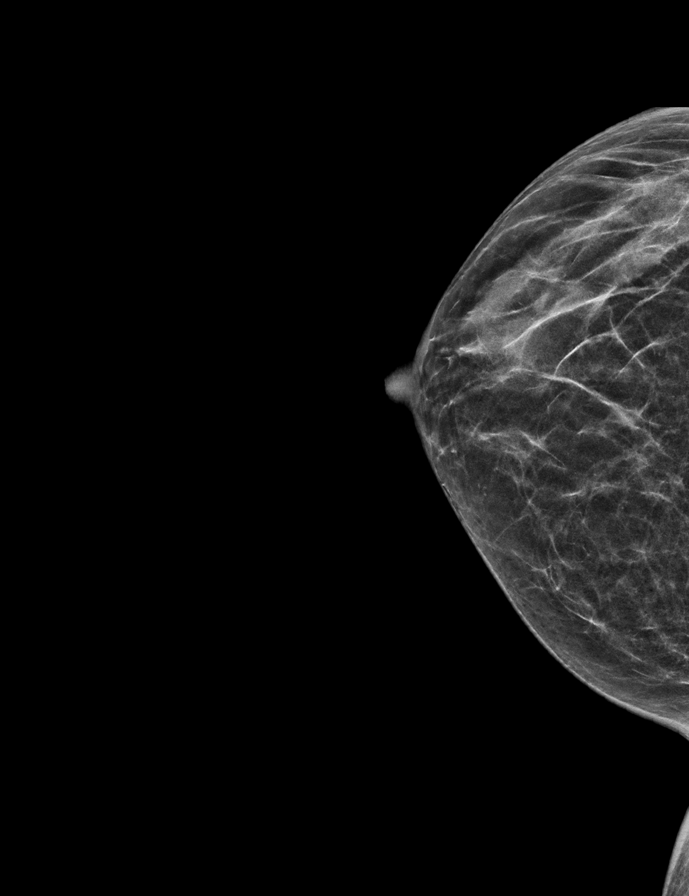

[L CC]
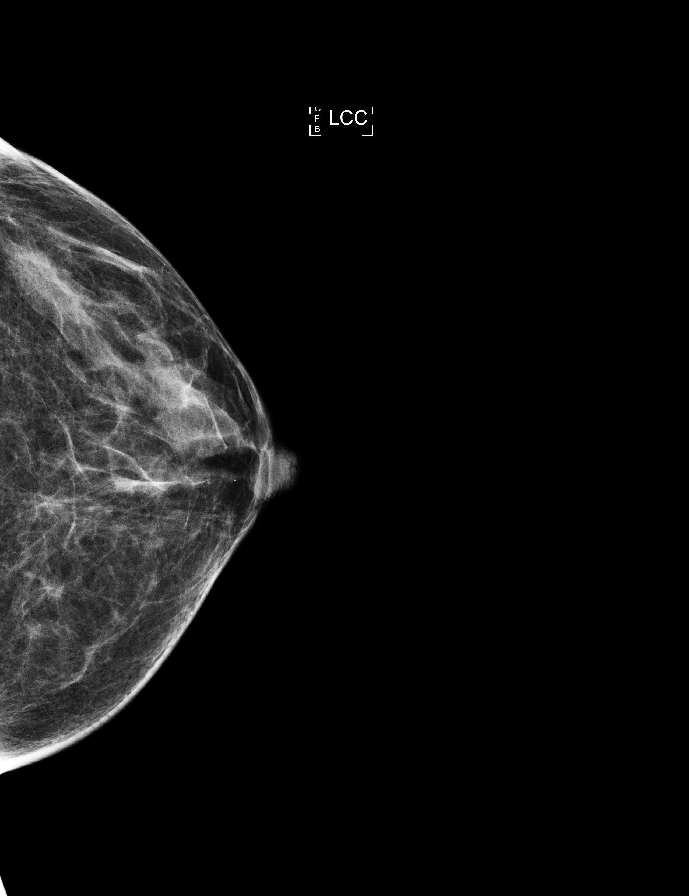

[R MLO]
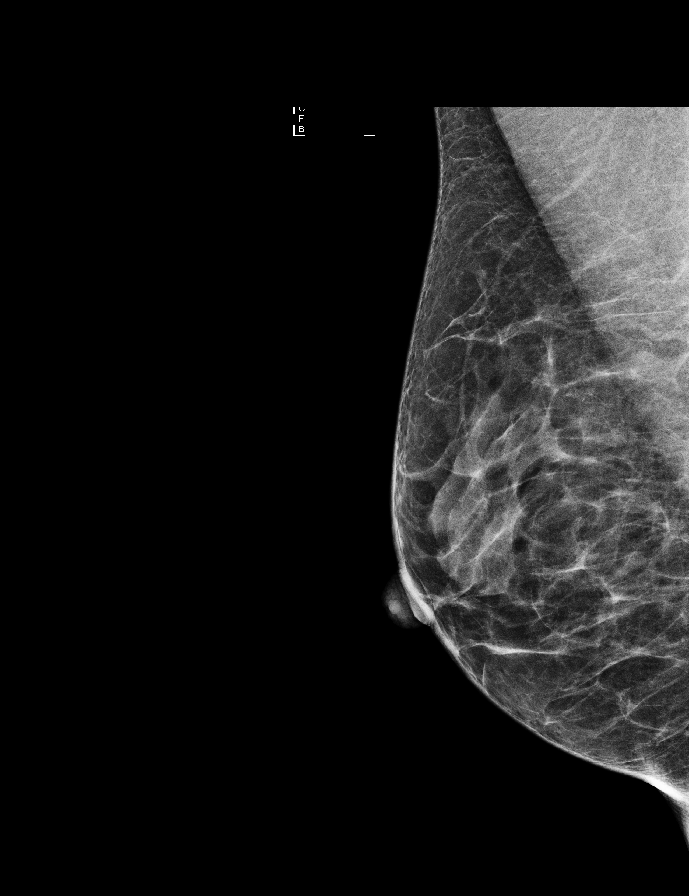

[R MLO synth-2D]
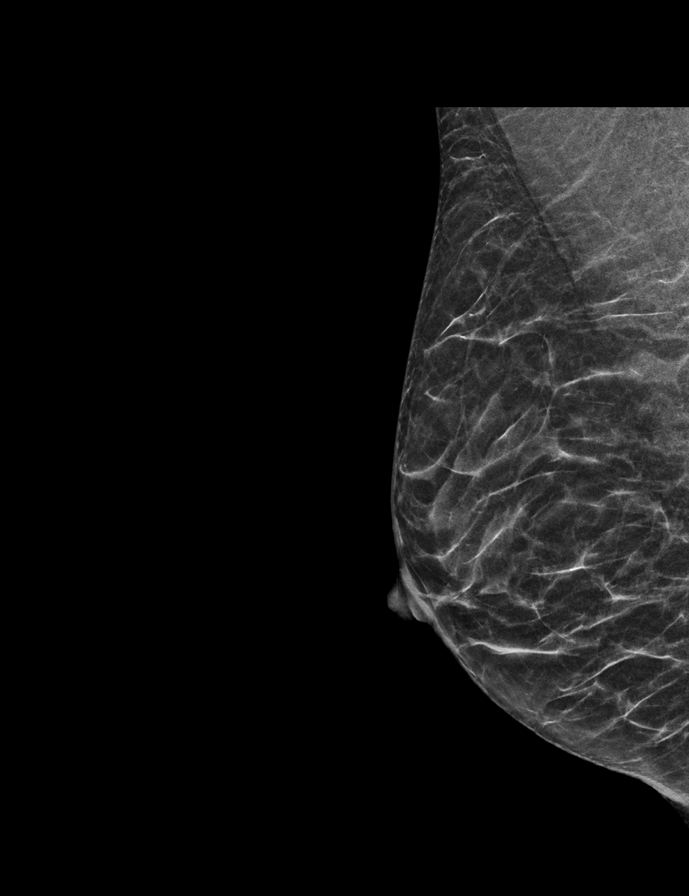

[L CC synth-2D]
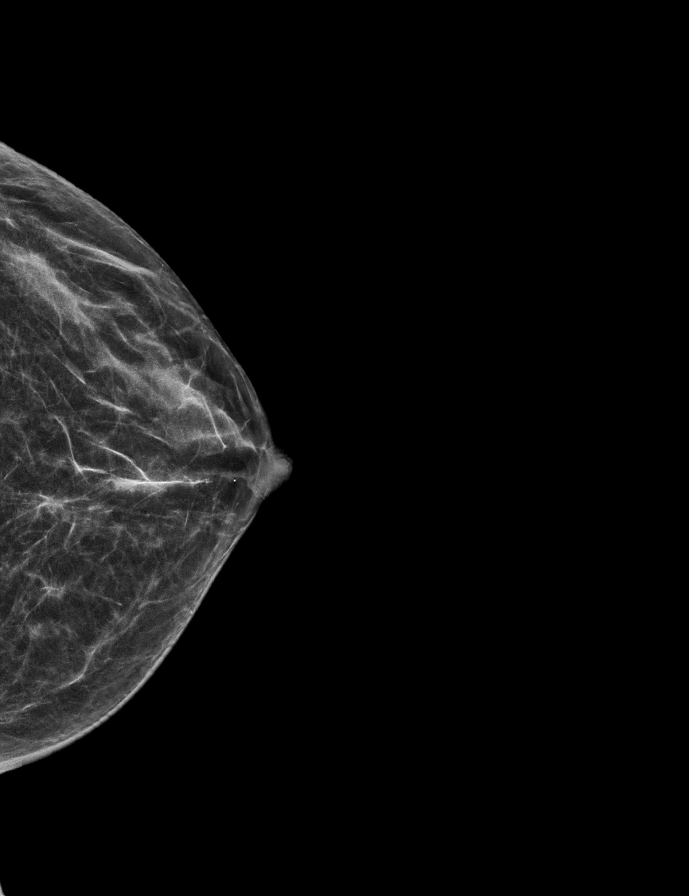

[L MLO synth-2D]
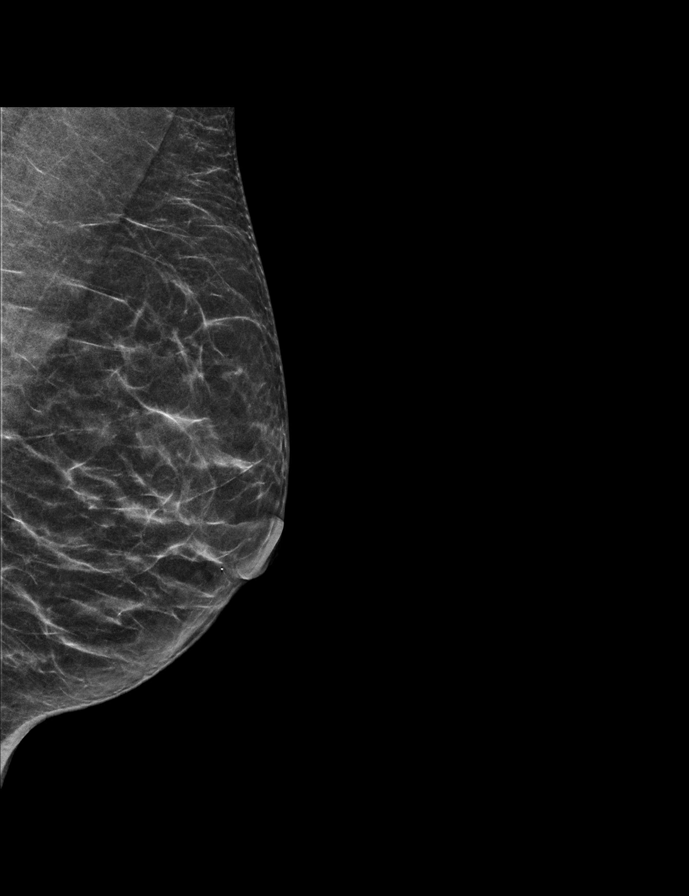

[R CC]
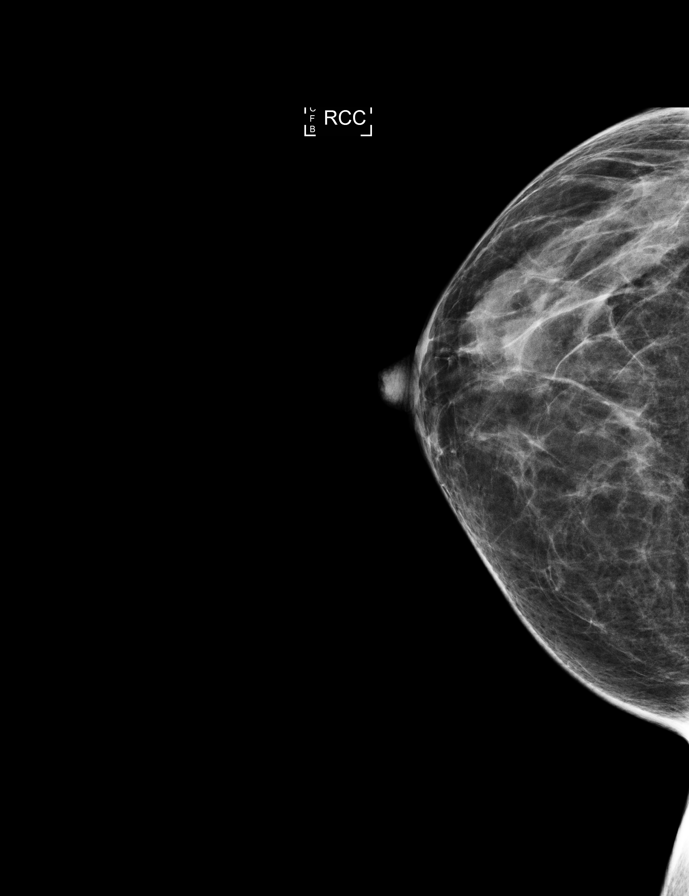

[8 of 28 positions shown; findings below may reference images not displayed]

ACR Breast Density Category b: There are scattered areas of
fibroglandular density.
FINDINGS: Bilateral 2D CC and MLO views were obtained today, with additional
3D tomosynthesis. There are no dominant masses, suspicious
calcifications or secondary signs of malignancy within either
breast. Specifically, there is no mammographic abnormality within
the inner left breast corresponding to the area of clinical concern.

Mammographic images were processed with CAD.

On physical exam, no circumscribed mass is palpated within the upper
inner quadrant of the left breast.

Targeted ultrasound is performed, evaluating the upper inner
quadrant of the left breast as directed by the patient, showing only
normal fibroglandular tissues and fat lobules throughout. No solid
or cystic mass.
IMPRESSION: No evidence of malignancy within either breast. Specifically, no
mammographic or sonographic evidence of malignancy within the upper
inner quadrant of the left breast corresponding to the area of
patient's breast pain.

RECOMMENDATION:
1. Annual screening mammograms.
2. Benign causes of breast pain, and possible remedies, were
discussed with the patient. Patient was encouraged to follow-up with
referring physician if pain became localized and persistent or if a
palpable lump/mass developed.

I have discussed the findings and recommendations with the patient.
Results were also provided in writing at the conclusion of the
visit. If applicable, a reminder letter will be sent to the patient
regarding the next appointment.

BI-RADS CATEGORY  1: Negative.

## 2019-01-09 ENCOUNTER — Ambulatory Visit: Payer: 59 | Attending: Internal Medicine

## 2019-01-09 DIAGNOSIS — Z20822 Contact with and (suspected) exposure to covid-19: Secondary | ICD-10-CM

## 2019-01-10 LAB — NOVEL CORONAVIRUS, NAA: SARS-CoV-2, NAA: NOT DETECTED

## 2019-05-05 ENCOUNTER — Other Ambulatory Visit: Payer: Self-pay

## 2019-05-05 ENCOUNTER — Ambulatory Visit
Admission: EM | Admit: 2019-05-05 | Discharge: 2019-05-05 | Disposition: A | Discharge status: Home / Self Care | Payer: 59

## 2019-05-05 ENCOUNTER — Encounter: Payer: Self-pay | Admitting: Emergency Medicine

## 2019-05-05 DIAGNOSIS — Z20822 Contact with and (suspected) exposure to covid-19: Secondary | ICD-10-CM

## 2019-05-05 MED ORDER — ALBUTEROL SULFATE HFA 108 (90 BASE) MCG/ACT IN AERS: 2.0000 | INHALATION_SPRAY | RESPIRATORY_TRACT | 0 refills | Status: DC | PRN

## 2019-05-05 MED ORDER — AEROCHAMBER PLUS FLO-VU MEDIUM MISC: 1.0000 | Freq: Once | 0 refills | Status: AC

## 2019-05-05 NOTE — Discharge Instructions (Addendum)
Your COVID test is pending - it is important to quarantine / isolate at home until your results are back. °If you test positive and would like further evaluation for persistent or worsening symptoms, you may schedule an E-visit or virtual (video) visit throughout the [facility] MyChart app or website. ° °PLEASE NOTE: If you develop severe chest pain or shortness of breath please go to the ER or call [phone] for further evaluation --> DO NOT schedule electronic or virtual visits for this. °Please call our office for further guidance / recommendations as needed. ° °For information about the Covid vaccine, please visit [email or web address] °

## 2019-05-05 NOTE — ED Provider Notes (Signed)
[facility]    CSN: [id number] Arrival date & time: 05/05/19  1445      History   Chief Complaint Chief Complaint  Patient presents with  . Chills    HPI Maureen Velazquez is a 44 y.o. female   Presenting for Covid testing: Exposure: daughter Date of exposure: 10 days ago Any fever, symptoms since exposure: Yes-malaise, chills, fatigue, myalgias.  Mild, dry cough with some tightness with deep inspiration.  Denying chest pain, shortness of breath, lower extremity edema, fever.  Reports history of asthma, though does not have up-to-date inhaler.  Requesting refill.   Past Medical History:  Diagnosis Date  . ADHD (attention deficit hyperactivity disorder), inattentive type   . Allergic rhinitis due to pollen   . Allergy   . Endometriosis     Patient Active Problem List   Diagnosis Date Noted  . Oral lichen planus 0000000  . Traveler's diarrhea 10/07/2015  . Preventative health care 04/15/2015    Past Surgical History:  Procedure Laterality Date  . LAPAROSCOPIC OVARIAN CYSTECTOMY    . MELANOMA EXCISION  07/2017   in situ---posterior neck  . OVARIAN CYST REMOVAL Right ~2007    OB History   No obstetric history on file.      Home Medications    Prior to Admission medications   Medication Sig Start Date End Date Taking? Authorizing Provider  Multiple Vitamin (MULTIVITAMIN) capsule Take 1 capsule by mouth daily.   Yes [provider]  albuterol (VENTOLIN HFA) 108 (90 Base) MCG/ACT inhaler Inhale 2 puffs into the lungs every 4 (four) hours as needed for wheezing or shortness of breath. 05/05/19   Hall-Potvin, [place], PA-C  Spacer/Aero-Holding Chambers (AEROCHAMBER PLUS FLO-VU MEDIUM) MISC 1 each by Other route once for 1 dose. 05/05/19 05/05/19  Hall-Potvin, [place], PA-C    Family History Family History  Problem Relation Age of Onset  . Cancer Maternal Grandmother        colon   . Cancer Maternal Grandfather        colon  .  Hyperlipidemia Mother   . Melanoma Father   . Heart disease Paternal Grandfather   . Stroke Paternal Grandfather   . Parkinson's disease Paternal Grandfather   . Hyperlipidemia Paternal Grandmother   . Diabetes Neg Hx     Social History Social History   Tobacco Use  . Smoking status: Never Smoker  . Smokeless tobacco: Never Used  Substance Use Topics  . Alcohol use: Yes    Alcohol/week: 0.0 standard drinks    Comment: occ wine  . Drug use: No     Allergies   Patient has no known allergies.   Review of Systems As per HPI   Physical Exam Triage Vital Signs ED Triage Vitals  Enc Vitals Group     BP 05/05/19 1453 105/74     Pulse Rate 05/05/19 1453 75     Resp 05/05/19 1453 16     Temp 05/05/19 1453 98 F (36.7 C)     Temp Source 05/05/19 1453 Oral     SpO2 05/05/19 1453 99 %     Weight --      Height --      Head Circumference --      Peak Flow --      Pain Score 05/05/19 1503 0     Pain Loc --      Pain Edu? --      Excl. in [place]? --    No data  found.  Updated Vital Signs BP 105/74 (BP Location: Left Arm)   Pulse 75   Temp 98 F (36.7 C) (Oral)   Resp 16   LMP 04/04/2019   SpO2 99%   Visual Acuity Right Eye Distance:   Left Eye Distance:   Bilateral Distance:    Right Eye Near:   Left Eye Near:    Bilateral Near:     Physical Exam Constitutional:      General: She is not in acute distress.    Appearance: She is obese. She is not ill-appearing or diaphoretic.  HENT:     Head: Normocephalic and atraumatic.     Mouth/Throat:     Mouth: Mucous membranes are moist.     Pharynx: Oropharynx is clear. No oropharyngeal exudate or posterior oropharyngeal erythema.  Eyes:     General: No scleral icterus.    Conjunctiva/sclera: Conjunctivae normal.     Pupils: Pupils are equal, round, and reactive to light.  Neck:     Comments: Trachea midline, negative JVD Cardiovascular:     Rate and Rhythm: Normal rate and regular rhythm.     Heart sounds: No  murmur. No gallop.   Pulmonary:     Effort: Pulmonary effort is normal. No respiratory distress.     Breath sounds: No wheezing, rhonchi or rales.  Musculoskeletal:     Cervical back: Neck supple. No tenderness.  Lymphadenopathy:     Cervical: No cervical adenopathy.  Skin:    Capillary Refill: Capillary refill takes less than 2 seconds.     Coloration: Skin is not jaundiced or pale.     Findings: No rash.  Neurological:     General: No focal deficit present.     Mental Status: She is alert and oriented to person, place, and time.      UC Treatments / Results  Labs (all labs ordered are listed, but only abnormal results are displayed) Labs Reviewed  NOVEL CORONAVIRUS, NAA    EKG   Radiology No results found.  Procedures Procedures (including critical care time)  Medications Ordered in UC Medications - No data to display  Initial Impression / Assessment and Plan / UC Course  I have reviewed the triage vital signs and the nursing notes.  Pertinent labs & imaging results that were available during my care of the patient were reviewed by me and considered in my medical decision making (see chart for details).     Patient afebrile, nontoxic, with SpO2 99%.  Covid PCR pending.  Patient to quarantine until results are back.  We will treat supportively as outlined below.  Return precautions discussed, patient verbalized understanding and is agreeable to plan. Final Clinical Impressions(s) / UC Diagnoses   Final diagnoses:  Exposure to COVID-19 virus     Discharge Instructions     Your COVID test is pending - it is important to quarantine / isolate at home until your results are back. If you test positive and would like further evaluation for persistent or worsening symptoms, you may schedule an E-visit or virtual (video) visit throughout the [facility] app or website.  PLEASE NOTE: If you develop severe chest pain or shortness of breath please go to the ER  or call [phone] for further evaluation --> DO NOT schedule electronic or virtual visits for this. Please call our office for further guidance / recommendations as needed.  For information about the Covid vaccine, please visit [email or web address]    ED Prescriptions    Medication  Sig Dispense Auth. Provider   albuterol (VENTOLIN HFA) 108 (90 Base) MCG/ACT inhaler Inhale 2 puffs into the lungs every 4 (four) hours as needed for wheezing or shortness of breath. 18 g Hall-Potvin, [place], PA-C   Spacer/Aero-Holding Chambers (AEROCHAMBER PLUS FLO-VU MEDIUM) MISC 1 each by Other route once for 1 dose. 1 each Hall-Potvin, [place], PA-C     PDMP not reviewed this encounter.   Hall-Potvin, [place], [place] 05/05/19 1541

## 2019-05-05 NOTE — ED Triage Notes (Signed)
Intermittent symptoms for 3-4 days.  Today, patient has felt the worse.  Patient is having chills, no fever, tired, fatigue, aching, minimal coughing, tightness with deep inspiration .

## 2019-05-06 LAB — NOVEL CORONAVIRUS, NAA: SARS-CoV-2, NAA: NOT DETECTED

## 2019-05-06 LAB — SARS-COV-2, NAA 2 DAY TAT

## 2019-08-29 ENCOUNTER — Ambulatory Visit: Payer: 59 | Admitting: Internal Medicine

## 2019-08-29 ENCOUNTER — Encounter: Payer: Self-pay | Admitting: Internal Medicine

## 2019-08-29 ENCOUNTER — Other Ambulatory Visit: Payer: Self-pay

## 2019-08-29 ENCOUNTER — Ambulatory Visit (INDEPENDENT_AMBULATORY_CARE_PROVIDER_SITE_OTHER)
Admission: RE | Admit: 2019-08-29 | Discharge: 2019-08-29 | Disposition: A | Discharge status: Home / Self Care | Payer: 59 | Source: Ambulatory Visit | Attending: Internal Medicine | Admitting: Internal Medicine

## 2019-08-29 DIAGNOSIS — M546 Pain in thoracic spine: Secondary | ICD-10-CM

## 2019-08-29 LAB — COMPREHENSIVE METABOLIC PANEL
ALT: 12 U/L (ref 0–35)
AST: 19 U/L (ref 0–37)
Albumin: 4.5 g/dL (ref 3.5–5.2)
Alkaline Phosphatase: 52 U/L (ref 39–117)
BUN: 15 mg/dL (ref 6–23)
CO2: 30 mEq/L (ref 19–32)
Calcium: 9.7 mg/dL (ref 8.4–10.5)
Chloride: 101 mEq/L (ref 96–112)
Creatinine, Ser: 0.81 mg/dL (ref 0.40–1.20)
GFR: 76.31 mL/min (ref >60.00)
Glucose, Bld: 85 mg/dL (ref 70–99)
Potassium: 4.5 mEq/L (ref 3.5–5.1)
Sodium: 138 mEq/L (ref 135–145)
Total Bilirubin: 0.7 mg/dL (ref 0.2–1.2)
Total Protein: 7.4 g/dL (ref 6.0–8.3)

## 2019-08-29 LAB — CBC
HCT: 39.4 % (ref 36.0–46.0)
Hemoglobin: 13.4 g/dL (ref 12.0–15.0)
MCHC: 34.1 g/dL (ref 30.0–36.0)
MCV: 87.6 fl (ref 78.0–100.0)
Platelets: 247 10*3/uL (ref 150.0–400.0)
RBC: 4.49 Mil/uL (ref 3.87–5.11)
RDW: 13.7 % (ref 11.5–15.5)
WBC: 5.5 10*3/uL (ref 4.0–10.5)

## 2019-08-29 LAB — SEDIMENTATION RATE: Sed Rate: 4 mm/hr (ref 0–20)

## 2019-08-29 NOTE — Progress Notes (Signed)
Subjective:    Patient ID: Maureen Velazquez, female    DOB: [birth year], 44 y.o.   MRN: [id number]  HPI Here due to thoracic pain This visit occurred during the SARS-CoV-2 public health emergency.  Safety protocols were in place, including screening questions prior to the visit, additional usage of staff PPE, and extensive cleaning of exam room while observing appropriate contact time as indicated for disinfecting solutions.   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 albuterol (VENTOLIN HFA) 108 (90 Base) MCG/ACT inhaler Inhale 2 puffs into the lungs every 4 (four) hours as needed for wheezing or shortness of breath. 18 g 0  . Multiple Vitamin (MULTIVITAMIN) capsule Take 1 capsule by mouth daily.     No current facility-administered medications on file prior to visit.    No Known Allergies  Past Medical History:  Diagnosis Date  . ADHD (attention deficit hyperactivity disorder), inattentive type   . Allergic rhinitis due to pollen   . Allergy   . Endometriosis     Past Surgical History:  Procedure Laterality Date  . LAPAROSCOPIC OVARIAN CYSTECTOMY    . MELANOMA EXCISION  07/2017   in situ---posterior neck  . OVARIAN CYST REMOVAL Right ~2007    Family History  Problem Relation Age of Onset  . Cancer Maternal Grandmother        colon   . Cancer Maternal Grandfather        colon  . Hyperlipidemia Mother   . Melanoma Father   . Heart disease Paternal Grandfather   . Stroke Paternal Grandfather   . Parkinson's disease  Paternal Grandfather   . Hyperlipidemia Paternal Grandmother   . Diabetes Neg Hx     Social History   Socioeconomic History  . Marital status: Married    Spouse name: Not on file  . Number of children: 2  . Years of education: Not on file  . Highest education level: Not on file  Occupational History  . 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 Smoking status: Never Smoker  . Smokeless tobacco: Never Used  Substance and Sexual Activity  . Alcohol use: Yes    Alcohol/week: 0.0 standard drinks    Comment: occ wine  . Drug use: No  . Sexual activity: Not on file  Other Topics Concern  . Not on file  Social History Narrative  . Not on file   Social Determinants of Health   Financial Resource Strain:   . Difficulty of Paying Living Expenses:   Food Insecurity:   . Worried About Charity fundraiser in the Last Year:   . Arboriculturist in the Last Year:   Transportation Needs:   . Film/video editor (Medical):   Marland Kitchen Lack of Transportation (Non-Medical):   Physical Activity:   . Days of Exercise per Week:   . Minutes of Exercise per Session:   Stress:   . Feeling of Stress :   Social Connections:   . Frequency of Communication with  Friends and Family:   . Frequency of Social Gatherings with Friends and Family:   . Attends Religious Services:   . Active Member of Clubs or Organizations:   . Attends Archivist Meetings:   Marland Kitchen Marital Status:   Intimate Partner Violence:   . Fear of Current or Ex-Partner:   . Emotionally Abused:   Marland Kitchen Physically Abused:   . Sexually Abused:    Review of Systems No cough  No fever Chest tightness at times---like a catch. No true pain May have lost 4-5# lately No GI symptoms    Objective:   Physical Exam Constitutional:      Appearance: Normal appearance.  Cardiovascular:     Rate and Rhythm: Normal rate and regular rhythm.     Heart sounds: No murmur heard.  No friction rub.  No gallop.   Pulmonary:     Effort: Pulmonary effort is normal.     Breath sounds: Normal breath sounds. No wheezing, rhonchi or rales.  Chest:     Chest wall: No tenderness.  Abdominal:     Palpations: Abdomen is soft.     Tenderness: There is no abdominal tenderness.  Musculoskeletal:     Right lower leg: No edema.     Left lower leg: No edema.     Comments: No spine or back tenderness  Neurological:     Mental Status: She is alert.            Assessment & Plan:

## 2019-08-29 NOTE — Assessment & Plan Note (Addendum)
Has feeling of pain on the inside---some pleuritic component May go back as long as 2 months No tenderness Nothing to suggest infection ?pleural, parenchymal, pericardial?????  Will check CXR and labs CXR looks normal Will await the labs If all normal, will wait at least a while Next step would probably be CT chest with contrast

## 2019-09-10 DIAGNOSIS — R0789 Other chest pain: Secondary | ICD-10-CM

## 2019-09-11 ENCOUNTER — Ambulatory Visit
Admission: EM | Admit: 2019-09-11 | Discharge: 2019-09-11 | Disposition: A | Discharge status: Home / Self Care | Payer: 59 | Attending: Emergency Medicine | Admitting: Emergency Medicine

## 2019-09-11 DIAGNOSIS — J069 Acute upper respiratory infection, unspecified: Secondary | ICD-10-CM | POA: Diagnosis not present

## 2019-09-11 HISTORY — DX: Unspecified asthma, uncomplicated: J45.909

## 2019-09-11 MED ORDER — BENZONATATE 100 MG PO CAPS
100.0000 mg | ORAL_CAPSULE | Freq: Three times a day (TID) | ORAL | 0 refills | Status: DC | PRN
Start: 2019-09-11 — End: 2021-11-19

## 2019-09-11 NOTE — ED Triage Notes (Signed)
Patient reports cough, fever, body aches, and nasal drainage x3 days. Patient reports she had a negative rapid COVID yesterday 09/10/2019.

## 2019-09-11 NOTE — Discharge Instructions (Signed)
Your COVID test is pending.  You should self quarantine until the test result is back.    Take Tessalon Perles as needed for cough.  Take Tylenol as needed for fever or discomfort.  Rest and keep yourself hydrated.    Go to the emergency department if you develop acute worsening symptoms.

## 2019-09-11 NOTE — ED Provider Notes (Signed)
[facility]    CSN: [id number] Arrival date & time: 09/11/19  1478      History   Chief Complaint Chief Complaint  Patient presents with  . Cough  . Nasal Drainage  . Generalized Body Aches  . Fever    HPI Maureen Velazquez is a 44 y.o. female.   Patient presents with 3-day history of fever, body aches, nonproductive cough, nasal drainage, congestion.  She had a rapid COVID test yesterday which was negative.  She denies shortness of breath, abdominal pain, vomiting, diarrhea, rash, or other symptoms.  Treatment attempted at home with OTC cold medication.  Patient has not received her COVID vaccinations.  The history is provided by the patient.    Past Medical History:  Diagnosis Date  . ADHD (attention deficit hyperactivity disorder), inattentive type   . Allergic rhinitis due to pollen   . Allergy   . Asthma   . Endometriosis     Patient Active Problem List   Diagnosis Date Noted  . Thoracic back pain 08/29/2019  . Oral lichen planus 29/56/2130  . Traveler's diarrhea 10/07/2015  . Preventative health care 04/15/2015    Past Surgical History:  Procedure Laterality Date  . LAPAROSCOPIC OVARIAN CYSTECTOMY    . MELANOMA EXCISION  07/2017   in situ---posterior neck  . OVARIAN CYST REMOVAL Right ~2007    OB History   No obstetric history on file.      Home Medications    Prior to Admission medications   Medication Sig Start Date End Date Taking? Authorizing Provider  albuterol (VENTOLIN HFA) 108 (90 Base) MCG/ACT inhaler Inhale 2 puffs into the lungs every 4 (four) hours as needed for wheezing or shortness of breath. 05/05/19   Hall-Potvin, [place], PA-C  benzonatate (TESSALON) 100 MG capsule Take 1 capsule (100 mg total) by mouth 3 (three) times daily as needed for cough. 09/11/19   Sharion Balloon, NP  Multiple Vitamin (MULTIVITAMIN) capsule Take 1 capsule by mouth daily.    [provider]    Family History Family History  Problem Relation  Age of Onset  . Cancer Maternal Grandmother        colon   . Cancer Maternal Grandfather        colon  . Hyperlipidemia Mother   . Melanoma Father   . Heart disease Paternal Grandfather   . Stroke Paternal Grandfather   . Parkinson's disease Paternal Grandfather   . Hyperlipidemia Paternal Grandmother   . Diabetes Neg Hx     Social History Social History   Tobacco Use  . Smoking status: Never Smoker  . Smokeless tobacco: Never Used  Substance Use Topics  . Alcohol use: Yes    Alcohol/week: 0.0 standard drinks    Comment: occ wine  . Drug use: No     Allergies   Patient has no known allergies.   Review of Systems Review of Systems  Constitutional: Positive for fever. Negative for chills.  HENT: Positive for congestion, postnasal drip and rhinorrhea. Negative for ear pain and sore throat.   Eyes: Negative for pain and visual disturbance.  Respiratory: Positive for cough. Negative for shortness of breath.   Cardiovascular: Negative for chest pain and palpitations.  Gastrointestinal: Negative for abdominal pain, diarrhea and vomiting.  Genitourinary: Negative for dysuria and hematuria.  Musculoskeletal: Negative for arthralgias and back pain.  Skin: Negative for color change and rash.  Neurological: Negative for seizures and syncope.  All other systems reviewed and are negative.  Physical Exam Triage Vital Signs ED Triage Vitals  Enc Vitals Group     BP      Pulse      Resp      Temp      Temp src      SpO2      Weight      Height      Head Circumference      Peak Flow      Pain Score      Pain Loc      Pain Edu?      Excl. in [place]?    No data found.  Updated Vital Signs BP 109/75   Pulse 96   Temp 98.5 F (36.9 C)   Resp 16   LMP 08/20/2019   SpO2 96%   Visual Acuity Right Eye Distance:   Left Eye Distance:   Bilateral Distance:    Right Eye Near:   Left Eye Near:    Bilateral Near:     Physical Exam Vitals and nursing note  reviewed.  Constitutional:      General: She is not in acute distress.    Appearance: She is well-developed. She is not ill-appearing.  HENT:     Head: Normocephalic and atraumatic.     Right Ear: Tympanic membrane normal.     Left Ear: Tympanic membrane normal.     Nose: Nose normal.     Mouth/Throat:     Mouth: Mucous membranes are moist.     Pharynx: Oropharynx is clear.  Eyes:     Conjunctiva/sclera: Conjunctivae normal.  Cardiovascular:     Rate and Rhythm: Normal rate and regular rhythm.     Heart sounds: No murmur heard.   Pulmonary:     Effort: Pulmonary effort is normal. No respiratory distress.     Breath sounds: Normal breath sounds. No wheezing or rhonchi.  Abdominal:     Palpations: Abdomen is soft.     Tenderness: There is no abdominal tenderness. There is no guarding or rebound.  Musculoskeletal:     Cervical back: Neck supple.  Skin:    General: Skin is warm and dry.     Findings: No rash.  Neurological:     General: No focal deficit present.     Mental Status: She is alert and oriented to person, place, and time.     Gait: Gait normal.  Psychiatric:        Mood and Affect: Mood normal.        Behavior: Behavior normal.      UC Treatments / Results  Labs (all labs ordered are listed, but only abnormal results are displayed) Labs Reviewed - No data to display  EKG   Radiology No results found.  Procedures Procedures (including critical care time)  Medications Ordered in UC Medications - No data to display  Initial Impression / Assessment and Plan / UC Course  I have reviewed the triage vital signs and the nursing notes.  Pertinent labs & imaging results that were available during my care of the patient were reviewed by me and considered in my medical decision making (see chart for details).   Viral URI.  Patient had a negative rapid COVID yesterday but request a PCR today.  PCR COVID pending.  Instructed patient to self quarantine until the  test result is back.  Tessalon Perles as needed for cough.  Discussed other symptomatic treatment including Tylenol, rest, hydration.  Instructed her to go to the  ED if she has acute worsening symptoms.  Patient agrees to plan of care.   Final Clinical Impressions(s) / UC Diagnoses   Final diagnoses:  Viral upper respiratory tract infection     Discharge Instructions     Your COVID test is pending.  You should self quarantine until the test result is back.    Take Tessalon Perles as needed for cough.  Take Tylenol as needed for fever or discomfort.  Rest and keep yourself hydrated.    Go to the emergency department if you develop acute worsening symptoms.        ED Prescriptions    Medication Sig Dispense Auth. Provider   benzonatate (TESSALON) 100 MG capsule Take 1 capsule (100 mg total) by mouth 3 (three) times daily as needed for cough. 21 capsule Sharion Balloon, NP     PDMP not reviewed this encounter.   Sharion Balloon, NP 09/11/19 [phone]

## 2019-09-13 LAB — NOVEL CORONAVIRUS, NAA: SARS-CoV-2, NAA: DETECTED — AB

## 2019-09-14 ENCOUNTER — Ambulatory Visit
Admission: EM | Admit: 2019-09-14 | Discharge: 2019-09-14 | Disposition: A | Discharge status: Home / Self Care | Payer: 59 | Attending: Family Medicine | Admitting: Family Medicine

## 2019-09-14 ENCOUNTER — Other Ambulatory Visit: Payer: Self-pay

## 2019-09-14 DIAGNOSIS — U071 COVID-19: Secondary | ICD-10-CM | POA: Diagnosis not present

## 2019-09-14 DIAGNOSIS — R05 Cough: Secondary | ICD-10-CM | POA: Diagnosis not present

## 2019-09-14 DIAGNOSIS — R059 Cough, unspecified: Secondary | ICD-10-CM

## 2019-09-14 DIAGNOSIS — R5383 Other fatigue: Secondary | ICD-10-CM | POA: Diagnosis not present

## 2019-09-14 DIAGNOSIS — R0602 Shortness of breath: Secondary | ICD-10-CM

## 2019-09-14 MED ORDER — ALBUTEROL SULFATE HFA 108 (90 BASE) MCG/ACT IN AERS: 2.0000 | INHALATION_SPRAY | RESPIRATORY_TRACT | 0 refills | Status: DC | PRN

## 2019-09-14 MED ORDER — PREDNISONE 10 MG (21) PO TBPK
ORAL_TABLET | Freq: Every day | ORAL | 0 refills | Status: AC
Start: 2019-09-14 — End: 2019-09-20

## 2019-09-14 NOTE — Discharge Instructions (Addendum)
You have Covid. You will likely be feeling fatigued for about another week  I have sent in a prednisone taper for you to take for 6 days. 6 tablets on day one, 5 tablets on day two, 4 tablets on day three, 3 tablets on day four, 2 tablets on day five, and 1 tablet on day six.  I have sent in an albuterol inhaler for you to use as needed   Follow up with this office or primary care as needed  Follow up in the ER if you are not maintaining your O2 saturations above 90%, increased shortness of breath, trouble swallowing, other concerns.

## 2019-09-14 NOTE — ED Triage Notes (Signed)
Pt presents to UC after positive COVID on 08/31.  Pt reports improvement in fever, body aches.  Is having pressure in head, chest, back- upper body in general.  Denies any trouble breathing.  Is taking Mucinex and Tylenol at home with some improvement.

## 2019-09-14 NOTE — ED Provider Notes (Signed)
[place]   [id number] 09/14/19 Arrival Time: 4098   CC: COVID symptoms  SUBJECTIVE: History from: patient.  Special Ranes is a 44 y.o. female who presents with abrupt onset of nasal congestion, PND, and persistent dry cough for the last 5 days. Reports that over the last day, she has developed shortness of breath and chest and back pain.  Covid positive at this time. Has not completed Covid vaccines. Has not taken OTC medications for this. There are no aggravating or alleviating factors. Denies previous symptoms in the past. Denies fever, chills, sinus pain, rhinorrhea, sore throat, SOB, wheezing, nausea, changes in bowel or bladder habits.    ROS: As per HPI.  All other pertinent ROS negative.     Past Medical History:  Diagnosis Date  . ADHD (attention deficit hyperactivity disorder), inattentive type   . Allergic rhinitis due to pollen   . Allergy   . Asthma   . Endometriosis    Past Surgical History:  Procedure Laterality Date  . LAPAROSCOPIC OVARIAN CYSTECTOMY    . MELANOMA EXCISION  07/2017   in situ---posterior neck  . OVARIAN CYST REMOVAL Right ~2007   No Known Allergies No current facility-administered medications on file prior to encounter.   Current Outpatient Medications on File Prior to Encounter  Medication Sig Dispense Refill  . benzonatate (TESSALON) 100 MG capsule Take 1 capsule (100 mg total) by mouth 3 (three) times daily as needed for cough. 21 capsule 0   Social History   Socioeconomic History  . Marital status: Married    Spouse name: Not on file  . Number of children: 2  . Years of education: Not on file  . Highest education level: Not on file  Occupational History  . 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 Smoking status: Never Smoker  . Smokeless tobacco: Never Used  Vaping Use  . Vaping Use: Never used  Substance and Sexual Activity  . Alcohol use: Yes    Alcohol/week: 0.0  standard drinks    Comment: occ wine  . Drug use: No  . Sexual activity: Not on file  Other Topics Concern  . Not on file  Social History Narrative  . Not on file   Social Determinants of Health   Financial Resource Strain:   . Difficulty of Paying Living Expenses: Not on file  Food Insecurity:   . Worried About Charity fundraiser in the Last Year: Not on file  . Ran Out of Food in the Last Year: Not on file  Transportation Needs:   . Lack of Transportation (Medical): Not on file  . Lack of Transportation (Non-Medical): Not on file  Physical Activity:   . Days of Exercise per Week: Not on file  . Minutes of Exercise per Session: Not on file  Stress:   . Feeling of Stress : Not on file  Social Connections:   . Frequency of Communication with Friends and Family: Not on file  . Frequency of Social Gatherings with Friends and Family: Not on file  . Attends Religious Services: Not on file  . Active Member of Clubs or Organizations: Not on file  . Attends Archivist Meetings: Not on file  . Marital Status: Not on file  Intimate Partner Violence:   . Fear of Current or Ex-Partner: Not on file  . Emotionally Abused: Not on file  . Physically Abused: Not on file  . Sexually Abused:  Not on file   Family History  Problem Relation Age of Onset  . Cancer Maternal Grandmother        colon   . Cancer Maternal Grandfather        colon  . Hyperlipidemia Mother   . Melanoma Father   . Heart disease Paternal Grandfather   . Stroke Paternal Grandfather   . Parkinson's disease Paternal Grandfather   . Hyperlipidemia Paternal Grandmother   . Diabetes Neg Hx     OBJECTIVE:  Vitals:   09/14/19 1144  BP: 106/73  Pulse: 75  Resp: 16  Temp: 98.4 F (36.9 C)  TempSrc: Oral  SpO2: 97%     General appearance: alert; appears fatigued, but nontoxic; speaking in full sentences and tolerating own secretions HEENT: NCAT; Ears: EACs clear, TMs pearly gray; Eyes: PERRL.  EOM  grossly intact. Sinuses: nontender; Nose: nares patent without rhinorrhea, Throat: oropharynx clear, tonsils non erythematous or enlarged, uvula midline  Neck: supple without LAD Lungs: unlabored respirations, symmetrical air entry; cough: mild; no respiratory distress; CTAB Heart: regular rate and rhythm.  Radial pulses 2+ symmetrical bilaterally Skin: warm and dry Psychological: alert and cooperative; normal mood and affect  LABS:  No results found for this or any previous visit (from the past 24 hour(s)).   ASSESSMENT & PLAN:  1. COVID-19   2. SOB (shortness of breath)   3. Cough   4. Other fatigue     Meds ordered this encounter  Medications  . albuterol (VENTOLIN HFA) 108 (90 Base) MCG/ACT inhaler    Sig: Inhale 2 puffs into the lungs every 4 (four) hours as needed for wheezing or shortness of breath.    Dispense:  18 g    Refill:  0    Order Specific Question:   Supervising Provider    Answer:   Chase Picket [id number]  . predniSONE (STERAPRED UNI-PAK 21 TAB) 10 MG (21) TBPK tablet    Sig: Take by mouth daily for 6 days. Take 6 tablets on day 1, 5 tablets on day 2, 4 tablets on day 3, 3 tablets on day 4, 2 tablets on day 5, 1 tablet on day 6    Dispense:  21 tablet    Refill:  0    Order Specific Question:   Supervising Provider    Answer:   Chase Picket [[id number]]      Continue tessalon perles as needed Prednisone taper prescribed Albuterol inhaler prescribed as needed You are going to feel fatigued for a while Don't push yourself too much Get plenty of rest and push fluids Use OTC zyrtec for nasal congestion, runny nose, and/or sore throat Use OTC flonase for nasal congestion and runny nose Use medications daily for symptom relief Use OTC medications like ibuprofen or tylenol as needed fever or pain Call or go to the ED if you have any new or worsening symptoms such as fever, worsening cough, shortness of breath, chest tightness, chest pain, turning blue,  changes in mental status.  Reviewed expectations re: course of current medical issues. Questions answered. Outlined signs and symptoms indicating need for more acute intervention. Patient verbalized understanding. After Visit Summary given.         Faustino Congress, NP 09/14/19 1244

## 2019-09-21 ENCOUNTER — Ambulatory Visit
Admission: RE | Admit: 2019-09-21 | Discharge: 2019-09-21 | Disposition: A | Discharge status: Home / Self Care | Payer: 59 | Source: Ambulatory Visit | Attending: Internal Medicine | Admitting: Internal Medicine

## 2019-09-21 ENCOUNTER — Other Ambulatory Visit: Payer: Self-pay

## 2019-09-21 DIAGNOSIS — R0789 Other chest pain: Secondary | ICD-10-CM

## 2019-09-21 MED ORDER — IOPAMIDOL (ISOVUE-370) INJECTION 76%
75.0000 mL | Freq: Once | INTRAVENOUS | Status: AC | PRN
  Administered 2019-09-21: 75 mL via INTRAVENOUS

## 2020-07-29 ENCOUNTER — Emergency Department (HOSPITAL_BASED_OUTPATIENT_CLINIC_OR_DEPARTMENT_OTHER): Payer: 59

## 2020-07-29 ENCOUNTER — Ambulatory Visit: Payer: 59 | Admitting: Family Medicine

## 2020-07-29 ENCOUNTER — Emergency Department (HOSPITAL_BASED_OUTPATIENT_CLINIC_OR_DEPARTMENT_OTHER)
Admission: EM | Admit: 2020-07-29 | Discharge: 2020-07-29 | Disposition: A | Discharge status: Home / Self Care | Payer: 59 | Attending: Emergency Medicine | Admitting: Emergency Medicine

## 2020-07-29 ENCOUNTER — Encounter (HOSPITAL_BASED_OUTPATIENT_CLINIC_OR_DEPARTMENT_OTHER): Payer: Self-pay | Admitting: Emergency Medicine

## 2020-07-29 ENCOUNTER — Telehealth: Payer: Self-pay

## 2020-07-29 ENCOUNTER — Other Ambulatory Visit: Payer: Self-pay

## 2020-07-29 DIAGNOSIS — J45909 Unspecified asthma, uncomplicated: Secondary | ICD-10-CM | POA: Insufficient documentation

## 2020-07-29 DIAGNOSIS — R110 Nausea: Secondary | ICD-10-CM | POA: Insufficient documentation

## 2020-07-29 DIAGNOSIS — M545 Low back pain, unspecified: Secondary | ICD-10-CM | POA: Diagnosis present

## 2020-07-29 DIAGNOSIS — S39012A Strain of muscle, fascia and tendon of lower back, initial encounter: Secondary | ICD-10-CM

## 2020-07-29 DIAGNOSIS — R509 Fever, unspecified: Secondary | ICD-10-CM | POA: Insufficient documentation

## 2020-07-29 LAB — CBC WITH DIFFERENTIAL/PLATELET
Abs Immature Granulocytes: 0.02 10*3/uL (ref 0.00–0.07)
Basophils Absolute: 0 10*3/uL (ref 0.0–0.1)
Basophils Relative: 0 %
Eosinophils Absolute: 0.1 10*3/uL (ref 0.0–0.5)
Eosinophils Relative: 1 %
HCT: 41 % (ref 36.0–46.0)
Hemoglobin: 13.6 g/dL (ref 12.0–15.0)
Immature Granulocytes: 0 %
Lymphocytes Relative: 19 %
Lymphs Abs: 1.2 10*3/uL (ref 0.7–4.0)
MCH: 29.8 pg (ref 26.0–34.0)
MCHC: 33.2 g/dL (ref 30.0–36.0)
MCV: 89.7 fL (ref 80.0–100.0)
Monocytes Absolute: 0.5 10*3/uL (ref 0.1–1.0)
Monocytes Relative: 7 %
Neutro Abs: 4.5 10*3/uL (ref 1.7–7.7)
Neutrophils Relative %: 73 %
Platelets: 230 10*3/uL (ref 150–400)
RBC: 4.57 MIL/uL (ref 3.87–5.11)
RDW: 12.9 % (ref 11.5–15.5)
WBC: 6.2 10*3/uL (ref 4.0–10.5)
nRBC: 0 % (ref 0.0–0.2)

## 2020-07-29 LAB — URINALYSIS, ROUTINE W REFLEX MICROSCOPIC
Bilirubin Urine: NEGATIVE
Glucose, UA: NEGATIVE mg/dL
Hgb urine dipstick: NEGATIVE
Ketones, ur: NEGATIVE mg/dL
Leukocytes,Ua: NEGATIVE
Nitrite: NEGATIVE
Protein, ur: NEGATIVE mg/dL
Specific Gravity, Urine: 1.01 (ref 1.005–1.030)
pH: 6.5 (ref 5.0–8.0)

## 2020-07-29 LAB — COMPREHENSIVE METABOLIC PANEL
ALT: 15 U/L (ref 0–44)
AST: 22 U/L (ref 15–41)
Albumin: 4.6 g/dL (ref 3.5–5.0)
Alkaline Phosphatase: 44 U/L (ref 38–126)
Anion gap: 9 (ref 5–15)
BUN: 18 mg/dL (ref 6–20)
CO2: 28 mmol/L (ref 22–32)
Calcium: 10 mg/dL (ref 8.9–10.3)
Chloride: 100 mmol/L (ref 98–111)
Creatinine, Ser: 0.96 mg/dL (ref 0.44–1.00)
GFR, Estimated: >60 mL/min (ref >60)
Glucose, Bld: 80 mg/dL (ref 70–99)
Potassium: 3.7 mmol/L (ref 3.5–5.1)
Sodium: 137 mmol/L (ref 135–145)
Total Bilirubin: 0.5 mg/dL (ref 0.3–1.2)
Total Protein: 7.8 g/dL (ref 6.5–8.1)

## 2020-07-29 LAB — LIPASE, BLOOD: Lipase: 37 U/L (ref 11–51)

## 2020-07-29 MED ORDER — MELOXICAM 7.5 MG PO TABS: 7.5000 mg | ORAL_TABLET | Freq: Every day | ORAL | 0 refills | Status: DC

## 2020-07-29 MED ORDER — ACETAMINOPHEN 325 MG PO TABS
650.0000 mg | ORAL_TABLET | Freq: Once | ORAL | Status: AC
  Administered 2020-07-29: 650 mg via ORAL
  Filled 2020-07-29: qty 2

## 2020-07-29 NOTE — Discharge Instructions (Addendum)
You were seen today for back pain.  There were no signs of any kidney infection on your CT scan.  Your urine looks clear of any urinary tract infection.  Overall, your blood work-up and imaging are very reassuring.  You can take meloxicam twice daily for the next 5 days as needed for the back pain.  This is a stronger anti-inflammatory than ibuprofen, please make sure you take it with food and water.  If things change or worsen please return back to the ED for further evaluation.  Please see your primary care doctor when she returned back from the beach for follow-up.

## 2020-07-29 NOTE — ED Triage Notes (Signed)
Pt arrives pov with c/o bilateral lower back x 5 days. Pt denies dysuria, reports urinary frequency/ denies fever. Pt denies otc meds today

## 2020-07-29 NOTE — Telephone Encounter (Signed)
Noted  

## 2020-07-29 NOTE — Telephone Encounter (Signed)
[facility] - Client TELEPHONE ADVICE RECORD [facility] Patient Name: Maureen Velazquez Gender: Female DOB: Dec 18, 1975 Age: 45 Y 48 M 13 D Return Phone Number: [phone] (Primary) Address: City/ State/ Zip: [place] [address] Client [facility] - Client Client Site [facility] - Day Physician Viviana Simpler- MD Contact Type Call Who Is Calling Patient / Member / Family / Caregiver Call Type Triage / Clinical Relationship To Patient Self Return Phone Number [phone] (Primary) Chief Complaint Back Pain - General Reason for Call Symptomatic / Request for [place] states she has issues with her kidney. Sx of kidney/lower back pain. Translation No Nurse Assessment Nurse: Rolin Barry, RN, Levada Dy Date/Time (Eastern Time): 07/29/2020 9:01:37 AM Confirm and document reason for call. If symptomatic, describe symptoms. ---Caller states she has issues with her kidney. Sx of kidney/lower back pain. Sx started Friday night. States that it is getting worse. BUN / Creatine levels high. Unknown temp. Does the patient have any new or worsening symptoms? ---Yes Will a triage be completed? ---Yes Related visit to physician within the last 2 weeks? ---Yes Does the PT have any chronic conditions? (i.e. diabetes, asthma, this includes High risk factors for pregnancy, etc.) ---No Is the patient pregnant or possibly pregnant? (Ask all females between the ages of 25-55) ---No Is this a behavioral health or substance abuse call? ---No Guidelines Guideline Title Affirmed Question Affirmed Notes Nurse Date/Time (Eastern Time) Flank Pain MODERATE pain (e.g., interferes with normal activities or awakens from sleep) Deaton, RN, Levada Dy 07/29/2020 9:03:41 AM Disp. Time Eilene Ghazi Time) Disposition Final User 07/29/2020 9:05:47 AM See PCP within 24 Hours Yes Deaton, RN, Levada Dy PLEASE  NOTE: All timestamps contained within this report are represented as [place] Standard Time. CONFIDENTIALTY NOTICE: This fax transmission is intended only for the addressee. It contains information that is legally privileged, confidential or otherwise protected from use or disclosure. If you are not the intended recipient, you are strictly prohibited from reviewing, disclosing, copying using or disseminating any of this information or taking any action in reliance on or regarding this information. If you have received this fax in error, please notify us immediately by telephone so that we can arrange for its return to [place]. Phone: [phone], Toll-Free: [phone], Fax: [phone] Page: 2 of 2 Call Id: [id number] [place] Disagree/Comply Comply Caller Understands Yes PreDisposition Did not know what to do Care Advice Given Per Guideline SEE PCP WITHIN 24 HOURS: * IF OFFICE WILL BE OPEN: You need to be examined within the next 24 hours. Call your doctor (or NP/PA) when the office opens and make an appointment. * ACETAMINOPHEN - EXTRA STRENGTH TYLENOL: Take 1,000 mg (two 500 mg pills) every 8 hours as needed. Each Extra Strength Tylenol pill has 500 mg of acetaminophen. The most you should take each day is 3,000 mg (6 pills a day). CALL BACK IF: * Fever over 100.4 F (38.0 C) * You become worse CARE ADVICE given per Flank Pain (Adult) guideline. 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 If no appts, will go to an UC. Caller advised that she is leaving for vacation tomorrow. Referrals REFERRED TO PCP OFFICE

## 2020-07-29 NOTE — Telephone Encounter (Signed)
Pt had called and scheduled appt with Dr Diona Browner 07/29/20 at 12 noon. Denisha on phones said pt called back and cancelled appt because could not wait until 12 noon to be seen and pt is going to ED.Dr Silvio Pate out of office and sending note to Dr Diona Browner.

## 2020-07-29 NOTE — ED Notes (Signed)
ED Provider at bedside. 

## 2020-07-29 NOTE — ED Provider Notes (Signed)
[facility] Provider Note   CSN: [id number] Arrival date & time: 07/29/20  1206     History Chief Complaint  Patient presents with   Back Pain    Maureen Velazquez is a 45 y.o. female.   Patient presents with low back pain x4 days.  It is constant, does not radiate elsewhere.  It was initially bilateral, but yesterday it started localizing to the left lower quadrant.  There is no dysuria or hematuria.  No history of kidney stones, no previous abdominal surgeries.  She has had subjective fevers and nausea but no vomiting.  She denies any aggravating factors such as movement, has not tried any alleviating factors.  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     Family History  Problem Relation Age of Onset   Cancer Maternal Grandmother        colon    Cancer Maternal Grandfather        colon   Hyperlipidemia Mother    Melanoma Father    Heart disease Paternal Grandfather    Stroke Paternal Grandfather    Parkinson's disease Paternal Grandfather    Hyperlipidemia Paternal Grandmother    Diabetes Neg Hx     Social History   Tobacco Use   Smoking status: Never   Smokeless tobacco: Never  Vaping Use   Vaping Use: Never used  Substance Use Topics   Alcohol use: Yes    Alcohol/week: 0.0 standard drinks    Comment: occ wine   Drug use: No    Home Medications Prior to Admission  medications   Medication Sig Start Date End Date Taking? Authorizing Provider  albuterol (VENTOLIN HFA) 108 (90 Base) MCG/ACT inhaler Inhale 2 puffs into the lungs every 4 (four) hours as needed for wheezing or shortness of breath. 09/14/19   Faustino Congress, NP  benzonatate (TESSALON) 100 MG capsule Take 1 capsule (100 mg total) by mouth 3 (three) times daily as needed for cough. 09/11/19   Sharion Balloon, NP    Allergies    Patient has no known allergies.  Review of Systems   Review of Systems  Musculoskeletal:  Positive for back pain.   Physical Exam Updated Vital Signs BP 95/67 (BP Location: Left Arm)   Pulse 81   Temp 98.4 F (36.9 C) (Oral)   Resp 20   Ht 5' (1.524 m)   Wt 53.1 kg   LMP 07/14/2020   SpO2 100%   BMI 22.85 kg/m   Physical Exam Vitals and nursing note reviewed. Exam conducted with a chaperone present.  Constitutional:      Appearance: Normal appearance.     Comments: Resting comfortably, well-appearing  HENT:     Head: Normocephalic and atraumatic.  Eyes:     General: No scleral icterus.       Right eye:  No discharge.        Left eye: No discharge.     Extraocular Movements: Extraocular movements intact.     Pupils: Pupils are equal, round, and reactive to light.  Cardiovascular:     Rate and Rhythm: Normal rate and regular rhythm.     Pulses: Normal pulses.     Heart sounds: Normal heart sounds. No murmur heard.   No friction rub. No gallop.  Pulmonary:     Effort: Pulmonary effort is normal. No respiratory distress.     Breath sounds: Normal breath sounds.  Abdominal:     General: Abdomen is flat. Bowel sounds are normal. There is no distension.     Palpations: Abdomen is soft.     Tenderness: There is no abdominal tenderness.     Comments: No CVA tenderness.  There is no tenderness on exam abdominal exam, no guarding or rigidity.  Abdomen is soft.  Musculoskeletal:     Comments: Patient has full range of motion to the back.  There is no  focal tenderness or midline tenderness.  No palpable deformities.  Skin:    General: Skin is warm and dry.     Coloration: Skin is not jaundiced.  Neurological:     Mental Status: She is alert. Mental status is at baseline.     Coordination: Coordination normal.     Comments: Cranial nerves III through XII are grossly intact.  Grip strength is equal bilaterally.    ED Results / Procedures / Treatments   Labs (all labs ordered are listed, but only abnormal results are displayed) Labs Reviewed  COMPREHENSIVE METABOLIC PANEL  URINALYSIS, ROUTINE W REFLEX MICROSCOPIC  CBC WITH DIFFERENTIAL/PLATELET  LIPASE, BLOOD    EKG None  Radiology No results found.  Procedures Procedures   Medications Ordered in ED Medications - No data to display  ED Course  I have reviewed the triage vital signs and the nursing notes.  Pertinent labs & imaging results that were available during my care of the patient were reviewed by me and considered in my medical decision making (see chart for details).  Clinical Course as of 07/29/20 2354  Tue Jul 29, 2020  1411 Urinalysis, Routine w reflex microscopic Urine, Clean Catch No UTI. No RBC consistent with kidney stones [HS]  1459 Comprehensive metabolic panel No electrolyte derangement, no AKI, no elevated LFTs concerning for biliary process [HS]  1459 CBC with Differential Patient without anemia, no leukocytosis.  I doubt infected kidney stone. [HS]    Clinical Course User Index [HS] Sherrill Raring, PA-C   MDM Rules/Calculators/A&P                          Patient is stable vitals and looks well on exam.  She has no focal tenderness anywhere, neurovascularly intact.  No recent trauma to the back.  I doubt a radiograph would be helpful.  However, will check basic labs.  We will also get a renal study to rule out kidney stones.   Labs are reassuring, please see ED course for interpretation.  CT scan without signs of intra-abdominal pathology or  kidney stones.  Will discharge patient with follow-up instructions.  Do not suspect emergent etiology at this time. Final Clinical Impression(s) / ED Diagnoses Final diagnoses:  None    Rx / DC Orders ED Discharge Orders     None        Sherrill Raring, [place] 07/29/20 2354  Gareth Morgan, MD 07/30/20 2215

## 2020-07-30 ENCOUNTER — Telehealth: Payer: Self-pay

## 2020-07-30 NOTE — Telephone Encounter (Signed)
Spoke to pt. She said she is feeling a little better. Plans to schedule an appt with Dr Silvio Pate in about 2 weeks to follow-up. I advised her if for some reason she cannot wait for Dr Silvio Pate, we have a sports medicine dr in the office.

## 2021-11-19 ENCOUNTER — Encounter: Payer: Self-pay | Admitting: Internal Medicine

## 2021-11-19 ENCOUNTER — Ambulatory Visit (INDEPENDENT_AMBULATORY_CARE_PROVIDER_SITE_OTHER): Payer: 59 | Admitting: Internal Medicine

## 2021-11-19 VITALS — BP 96/66 | HR 84 | Temp 97.7°F | Ht 60.5 in | Wt 117.0 lb

## 2021-11-19 DIAGNOSIS — Z0000 Encounter for general adult medical examination without abnormal findings: Secondary | ICD-10-CM | POA: Diagnosis not present

## 2021-11-19 NOTE — Progress Notes (Signed)
Subjective:    Patient ID: Maureen Velazquez, female    DOB: 1975-12-12, 46 y.o.   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     Nutritional Supplements (ESTROVEN PO) Take by mouth.     nystatin-triamcinolone ointment (MYCOLOG) Apply 1 Application topically.     No current facility-administered medications on file prior to visit.    No Known Allergies  Past Medical History:  Diagnosis Date   ADHD (attention deficit hyperactivity disorder), inattentive type    Allergic rhinitis due to pollen    Allergy    Asthma    Endometriosis     Past Surgical History:  Procedure Laterality Date   LAPAROSCOPIC OVARIAN CYSTECTOMY     MELANOMA EXCISION  07/2017   in situ---posterior neck   OVARIAN CYST REMOVAL Right ~2007    Family History  Problem Relation Age of Onset   Cancer Maternal Grandmother        colon    Cancer Maternal Grandfather        colon   Hyperlipidemia Mother    Melanoma Father    Heart disease Paternal Grandfather    Stroke Paternal Grandfather    Parkinson's disease Paternal Grandfather    Hyperlipidemia Paternal Grandmother    Diabetes Neg Hx     Social History   Socioeconomic History   Marital status: Married    Spouse name: Not on file   Number of children: 2   Years of education: Not on file   Highest education level: Not on file  Occupational History   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  Tobacco Use   Smoking status: Never    Passive exposure: Past   Smokeless tobacco: Never  Vaping Use   Vaping Use: Never used  Substance and Sexual Activity   Alcohol use: Yes    Alcohol/week: 0.0 standard drinks of alcohol    Comment: occ wine   Drug use: No   Sexual activity: Not on file  Other Topics Concern   Not on file  Social History Narrative   Not on file   Social Determinants of Health   Financial Resource Strain: Not on file  Food Insecurity: Not on file  Transportation Needs: Not on file  Physical Activity: Not on file  Stress: Not on file  Social Connections: Not on file  Intimate Partner Violence: Not on file   Review of Systems  Constitutional:  Negative for fatigue and unexpected weight change.       Wears seat belt---mostly. discussed  HENT:  Negative for hearing loss and tinnitus.        Lichen planus in mouth---got oral steroid from derm (Dr Pearline Cables) Teeth fine  Eyes:  Negative for visual disturbance.       No diplopia or unilateral vision loss  Respiratory:  Negative for cough, chest tightness and shortness of breath.   Cardiovascular:  Positive for palpitations. Negative for chest  pain and leg swelling.       Palpitations never with activity  Gastrointestinal:  Negative for blood in stool and constipation.       No heartburn  Endocrine: Negative for polydipsia and polyuria.  Genitourinary:  Negative for dyspareunia, dysuria and hematuria.  Musculoskeletal:  Negative for arthralgias and joint swelling.       Some upper back issues---no meds  Skin:  Negative for rash.  Allergic/Immunologic: Positive for environmental allergies. Negative for immunocompromised state.       Uses claritin D prn  Neurological:  Negative for dizziness, syncope, light-headedness and headaches.  Hematological:  Negative for adenopathy. Does not bruise/bleed easily.  Psychiatric/Behavioral:  Negative for dysphoric mood.  The patient is not nervous/anxious.        Sleeping better now       Objective:   Physical Exam Constitutional:      Appearance: Normal appearance.  HENT:     Mouth/Throat:     Pharynx: No oropharyngeal exudate or posterior oropharyngeal erythema.  Eyes:     Conjunctiva/sclera: Conjunctivae normal.     Pupils: Pupils are equal, round, and reactive to light.  Cardiovascular:     Rate and Rhythm: Normal rate and regular rhythm.     Pulses: Normal pulses.     Heart sounds: No murmur heard.    No gallop.  Pulmonary:     Effort: Pulmonary effort is normal.     Breath sounds: Normal breath sounds. No wheezing or rales.  Abdominal:     Palpations: Abdomen is soft.     Tenderness: There is no abdominal tenderness.  Musculoskeletal:     Cervical back: Neck supple.     Right lower leg: No edema.     Left lower leg: No edema.  Lymphadenopathy:     Cervical: No cervical adenopathy.  Skin:    Findings: No lesion or rash.  Neurological:     General: No focal deficit present.     Mental Status: She is alert and oriented to person, place, and time.  Psychiatric:        Mood and Affect: Mood normal.        Behavior: Behavior normal.            Assessment & Plan:

## 2021-11-19 NOTE — Assessment & Plan Note (Signed)
Healthy Stays fit--even teaches classes Colon due again 2027 Paps/mammograms with gyn Prefers no flu/COVID vaccines

## 2022-10-17 ENCOUNTER — Ambulatory Visit
Admission: EM | Admit: 2022-10-17 | Discharge: 2022-10-17 | Disposition: A | Discharge status: Home / Self Care | Payer: 59 | Attending: Internal Medicine | Admitting: Internal Medicine

## 2022-10-17 DIAGNOSIS — L03011 Cellulitis of right finger: Secondary | ICD-10-CM | POA: Diagnosis not present

## 2022-10-17 MED ORDER — CEPHALEXIN 500 MG PO CAPS: 500.0000 mg | ORAL_CAPSULE | Freq: Four times a day (QID) | ORAL | 0 refills | Status: DC

## 2022-10-17 NOTE — ED Triage Notes (Signed)
Patient presents with redness around her right pinky x 1 week. Tx: soaking in Epson salt without relief.

## 2022-10-17 NOTE — ED Provider Notes (Signed)
[facility]    CSN: [id number] Arrival date & time: 10/17/22  0807      History   Chief Complaint No chief complaint on file.   HPI Maureen Velazquez is a 47 y.o. female.   Patient presents with swelling and redness surrounding the skin of the nail of the right fifth digit.  Reports that she pulled off a hangnail prior to this occurring.  Denies any purulent drainage or fever.  Has been soaking in Epsom salt.     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      Home Medications    Prior to Admission medications   Medication Sig Start Date End Date Taking? Authorizing Provider  cephALEXin (KEFLEX) 500 MG capsule Take 1 capsule (500 mg total) by mouth 4 (four) times daily. 10/17/22   Gustavus Bryant, FNP  MAGNESIUM GLYCINATE PO Take by mouth.    [provider]  Nutritional Supplements (ESTROVEN PO) Take by mouth.    [provider]  nystatin-triamcinolone ointment (MYCOLOG) Apply 1 Application topically. 10/28/21   [provider]    Family History Family History  Problem Relation Age of Onset   Hypertension Mother    Heart disease Mother        coronary stents   Hyperlipidemia Mother    Hypertension Father    Hyperlipidemia Father    Melanoma Father    Cancer Maternal Grandmother        colon    Cancer Maternal Grandfather        colon   Hyperlipidemia Paternal Grandmother    Heart disease Paternal  Grandfather    Stroke Paternal Grandfather    Parkinson's disease Paternal Grandfather    Diabetes Neg Hx     Social History Social History   Tobacco Use   Smoking status: Never    Passive exposure: Past   Smokeless tobacco: Never  Vaping Use   Vaping status: Never Used  Substance Use Topics   Alcohol use: Not Currently    Comment: occ wine   Drug use: No     Allergies   Patient has no known allergies.   Review of Systems Review of Systems Per HPI  Physical Exam Triage Vital Signs ED Triage Vitals  Encounter Vitals Group     BP 10/17/22 0819 (!) 89/64     Systolic BP Percentile --      Diastolic BP Percentile --      Pulse Rate 10/17/22 0819 74     Resp 10/17/22 0819 14     Temp 10/17/22 0819 97.8 F (36.6 C)     Temp Source 10/17/22 0819 Oral     SpO2 10/17/22 0819 98 %     Weight 10/17/22 0817 117 lb (53.1 kg)     Height 10/17/22 0817 5' (1.524 m)     Head Circumference --  Peak Flow --      Pain Score 10/17/22 0817 4     Pain Loc --      Pain Education --      Exclude from Growth Chart --    No data found.  Updated Vital Signs BP 101/68 (BP Location: Left Arm)   Pulse 74   Temp 97.8 F (36.6 C) (Oral)   Resp 14   Ht 5' (1.524 m)   Wt 117 lb (53.1 kg)   LMP 10/11/2022   SpO2 98%   BMI 22.85 kg/m   Visual Acuity Right Eye Distance:   Left Eye Distance:   Bilateral Distance:    Right Eye Near:   Left Eye Near:    Bilateral Near:     Physical Exam Constitutional:      General: She is not in acute distress.    Appearance: Normal appearance. She is not toxic-appearing or diaphoretic.  HENT:     Head: Normocephalic and atraumatic.  Eyes:     Extraocular Movements: Extraocular movements intact.     Conjunctiva/sclera: Conjunctivae normal.  Pulmonary:     Effort: Pulmonary effort is normal.  Skin:    Comments: Patient has mild erythema and swelling present to the medial portion of the skin surrounding the right fifth digit.  No  area of fluctuance.  No purulent drainage.  Capillary refill and pulses intact.  Full range of motion of finger present.  Neurological:     General: No focal deficit present.     Mental Status: She is alert and oriented to person, place, and time. Mental status is at baseline.  Psychiatric:        Mood and Affect: Mood normal.        Behavior: Behavior normal.        Thought Content: Thought content normal.        Judgment: Judgment normal.      UC Treatments / Results  Labs (all labs ordered are listed, but only abnormal results are displayed) Labs Reviewed - No data to display  EKG   Radiology No results found.  Procedures Procedures (including critical care time)  Medications Ordered in UC Medications - No data to display  Initial Impression / Assessment and Plan / UC Course  I have reviewed the triage vital signs and the nursing notes.  Pertinent labs & imaging results that were available during my care of the patient were reviewed by me and considered in my medical decision making (see chart for details).     Patient has paronychia of right fifth digit.  No I&D necessary at this time.  Will treat with cephalexin and patient encouraged to continue Epsom salt soaks as I do think this will be helpful in decreasing inflammation.  Advised her to follow-up if symptoms persist or worsen.  Patient verbalized understanding and was agreeable with plan. Final Clinical Impressions(s) / UC Diagnoses   Final diagnoses:  Paronychia, finger, right     Discharge Instructions      You have a paronychia which is an infection of the skin surrounding the nail and cuticle.  I have prescribed an antibiotic to treat this.  Follow-up if any symptoms persist or worsen.    ED Prescriptions     Medication Sig Dispense Auth. Provider   cephALEXin (KEFLEX) 500 MG capsule  (Status: Discontinued) Take 1 capsule (500 mg total) by mouth 4 (four) times daily. 28 capsule [place], [place], [place]    cephALEXin (KEFLEX) 500 MG  capsule Take 1 capsule (500 mg total) by mouth 4 (four) times daily. 28 capsule [place], Acie Fredricks[place], [place]      PDMP not reviewed this encounter.   Gustavus Bryant, [place] 10/17/22 [phone]

## 2022-10-17 NOTE — Discharge Instructions (Signed)
You have a paronychia which is an infection of the skin surrounding the nail and cuticle.  I have prescribed an antibiotic to treat this.  Follow-up if any symptoms persist or worsen.

## 2023-01-12 DIAGNOSIS — I7300 Raynaud's syndrome without gangrene: Secondary | ICD-10-CM

## 2023-01-12 HISTORY — DX: Raynaud's syndrome without gangrene: I73.00

## 2023-02-04 ENCOUNTER — Other Ambulatory Visit: Payer: Self-pay

## 2023-02-04 ENCOUNTER — Ambulatory Visit
Admission: RE | Admit: 2023-02-04 | Discharge: 2023-02-04 | Disposition: A | Discharge status: Home / Self Care | Payer: 59 | Source: Ambulatory Visit | Attending: Family Medicine | Admitting: Family Medicine

## 2023-02-04 VITALS — BP 112/64 | HR 77 | Temp 98.2°F | Resp 16 | Ht 60.0 in | Wt 118.0 lb

## 2023-02-04 DIAGNOSIS — L609 Nail disorder, unspecified: Secondary | ICD-10-CM | POA: Diagnosis not present

## 2023-02-04 DIAGNOSIS — L03012 Cellulitis of left finger: Secondary | ICD-10-CM

## 2023-02-04 MED ORDER — MUPIROCIN 2 % EX OINT: 1.0000 | TOPICAL_OINTMENT | Freq: Two times a day (BID) | CUTANEOUS | 0 refills | Status: AC

## 2023-02-04 MED ORDER — CEPHALEXIN 500 MG PO CAPS: 500.0000 mg | ORAL_CAPSULE | Freq: Four times a day (QID) | ORAL | 0 refills | Status: AC

## 2023-02-04 NOTE — ED Provider Notes (Signed)
[facility]    CSN: [id number] Arrival date & time: 02/04/23  1432      History   Chief Complaint Chief Complaint  Patient presents with   Hand Pain    HPI Maureen Velazquez is a 48 y.o. female.    Hand Pain  Had a paronychia in her right fifth finger several months ago recently area became red and she noted some changes to the base of the nail.  Admits local tenderness, denies drainage.  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      Home Medications    Prior to Admission medications   Medication Sig Start Date End Date Taking? Authorizing Provider  mupirocin ointment (BACTROBAN) 2 % Apply 1 Application topically 2 (two) times daily for 10 days. 02/04/23 02/14/23 Yes Meliton Rattan, PA  cephALEXin (KEFLEX) 500 MG capsule Take 1 capsule (500 mg total) by mouth 4 (four) times daily for 7 days. 02/04/23 02/11/23  Meliton Rattan, PA  MAGNESIUM GLYCINATE PO Take by mouth.    [provider]  Nutritional Supplements (ESTROVEN PO) Take by mouth.    [provider]    Family History Family History  Problem Relation Age of Onset   Hypertension Mother    Heart disease Mother        coronary stents   Hyperlipidemia Mother    Hypertension Father    Hyperlipidemia Father    Melanoma Father    Cancer Maternal Grandmother        colon    Cancer Maternal Grandfather        colon   Hyperlipidemia Paternal  Grandmother    Heart disease Paternal Grandfather    Stroke Paternal Grandfather    Parkinson's disease Paternal Grandfather    Diabetes Neg Hx     Social History Social History   Tobacco Use   Smoking status: Never    Passive exposure: Past   Smokeless tobacco: Never  Vaping Use   Vaping status: Never Used  Substance Use Topics   Alcohol use: Not Currently    Comment: occ wine   Drug use: No     Allergies   Patient has no known allergies.   Review of Systems Review of Systems   Physical Exam Triage Vital Signs ED Triage Vitals  Encounter Vitals Group     BP 02/04/23 1459 112/64     Systolic BP Percentile --      Diastolic BP Percentile --      Pulse Rate 02/04/23 1459 77     Resp 02/04/23 1459 16     Temp 02/04/23 1459 98.2 F (36.8 C)     Temp Source 02/04/23 1459 Oral     SpO2 02/04/23 1459 97 %     Weight 02/04/23 1456 118 lb (53.5 kg)     Height 02/04/23 1456 5' (1.524 m)  Head Circumference --      Peak Flow --      Pain Score 02/04/23 1456 0     Pain Loc --      Pain Education --      Exclude from Growth Chart --    No data found.  Updated Vital Signs BP 112/64 (BP Location: Right Arm)   Pulse 77   Temp 98.2 F (36.8 C) (Oral)   Resp 16   Ht 5' (1.524 m)   Wt 118 lb (53.5 kg)   LMP 01/16/2023 (Exact Date)   SpO2 97%   BMI 23.05 kg/m   Visual Acuity Right Eye Distance:   Left Eye Distance:   Bilateral Distance:    Right Eye Near:   Left Eye Near:    Bilateral Near:     Physical Exam Constitutional:      Appearance: She is not ill-appearing.  Skin:    General: Skin is warm.     Comments: Right fifth finger has minimal erythema small breaks in the skin along the cuticle there is white discoloration of the proximal nail with some pitting, not see a fluid collection, no active drainage  Neurological:     Mental Status: She is alert.      UC Treatments / Results  Labs (all labs ordered are listed, but only abnormal results  are displayed) Labs Reviewed - No data to display  EKG   Radiology No results found.  Procedures Procedures (including critical care time)  Medications Ordered in UC Medications - No data to display  Initial Impression / Assessment and Plan / UC Course  I have reviewed the triage vital signs and the nursing notes.  Pertinent labs & imaging results that were available during my care of the patient were reviewed by me and considered in my medical decision making (see chart for details).     48 year old female with recent paronychia presents with worsening symptoms for several days.  [place] appearing changes to the nail with some minimal erythema along the cuticle.  Discussed with patient her nail changes may be due to injury to the nail matrix with the last infection recommend warm soaks, will start cephalexin and mupirocin, she was counseled to follow-up with her PCP for worsening symptoms.  She wants to see dermatology. Final Clinical Impressions(s) / UC Diagnoses   Final diagnoses:  Paronychia of finger of left hand  Nail abnormality     Discharge Instructions      Follow-up with dermatology as discussed   ED Prescriptions     Medication Sig Dispense Auth. Provider   cephALEXin (KEFLEX) 500 MG capsule Take 1 capsule (500 mg total) by mouth 4 (four) times daily for 7 days. 28 capsule Meliton Rattan, PA   mupirocin ointment (BACTROBAN) 2 % Apply 1 Application topically 2 (two) times daily for 10 days. 22 g Meliton Rattan, [place]      PDMP not reviewed this encounter.   Meliton Rattan, [place] 02/04/23 [phone]

## 2023-02-04 NOTE — ED Triage Notes (Addendum)
Pt presents with complaints of "infected hang nail in October, prescribed antibiotics but I believe there is an infection starting to grow again." Pt is speaking of nail on pinky finger on right hand. Pt currently denies pain & denies taking any medications for symptoms reported. Pt did complete the entire dose of antibiotics prescribed in October.

## 2023-02-04 NOTE — Discharge Instructions (Signed)
Follow-up with dermatology as discussed

## 2023-06-22 ENCOUNTER — Ambulatory Visit: Admitting: Podiatry

## 2023-06-22 ENCOUNTER — Ambulatory Visit (INDEPENDENT_AMBULATORY_CARE_PROVIDER_SITE_OTHER)

## 2023-06-22 ENCOUNTER — Encounter: Payer: Self-pay | Admitting: Podiatry

## 2023-06-22 VITALS — Ht 60.0 in | Wt 118.0 lb

## 2023-06-22 DIAGNOSIS — M2021 Hallux rigidus, right foot: Secondary | ICD-10-CM | POA: Diagnosis not present

## 2023-06-22 DIAGNOSIS — M779 Enthesopathy, unspecified: Secondary | ICD-10-CM | POA: Diagnosis not present

## 2023-06-22 NOTE — Progress Notes (Signed)
 Chief Complaint  Patient presents with   Foot Pain    Pt is here due to right foot pain, pain has been in foot for about 3 years off and on, states she is unable to walk correctly due to pain , seen ortho doctor in march and received a cortisone shot in her foot, did not help much with the pain.    HPI: 48 y.o. female healthy very active presenting today for evaluation of chronic right great toe pain.   Brief history: No history of injury.  Onset about 3 years ago which has progressively gotten worse.  She has been seen by orthopedics and they have provided her with cortisone injections which only helped temporarily.  She has also tried shoe gear modifications and oral anti-inflammatories with minimal relief.  Past Medical History:  Diagnosis Date   ADHD (attention deficit hyperactivity disorder), inattentive type    Allergic rhinitis due to pollen    Allergy    Asthma    Endometriosis     Past Surgical History:  Procedure Laterality Date   LAPAROSCOPIC OVARIAN CYSTECTOMY     MELANOMA EXCISION  07/2017   in situ---posterior neck   OVARIAN CYST REMOVAL Right ~2007    No Known Allergies   Physical Exam: General: The patient is alert and oriented x3 in no acute distress.  Dermatology: Skin is warm, dry and supple bilateral lower extremities.   Vascular: Palpable pedal pulses bilaterally. Capillary refill within normal limits.  No appreciable edema.  No erythema.  Neurological: Grossly intact via light touch  Musculoskeletal Exam: Enlargement of the first MTP noted on clinical exam with limited range of motion and crepitus as well.  There is also pain with palpation and range of motion of the first MTP.  Radiographic Exam RT foot 06/22/2023:  Joint space narrowing with para-articular spurring noted to the first MTP of the right foot consistent with hallux rigidus/arthritis  Assessment/Plan of Care: 1.  Hallux rigidus/arthritis right great toe  -Patient evaluated.  X-rays  reviewed -Today we discussed and great length in detail conservative versus surgical management of the arthritis to the right great toe joint with periarticular spurring.  Conservatively recommend good supportive shoes that are wide fitting and do not constrict the toebox area.  Also recommend a rocker type bottom shoe that allows you to rock through the motion of the forefoot, essentially limited to the motion of the first MTP. -Surgically we discussed in length in detail cheilectomy vs. first MTP arthrodesis/fusion vs. first MTP arthroplasty with implant/joint replacement.  Each surgical procedure was explained in detail including the risks and benefits of each.  Ultimately I do believe the patient would benefit from right great toe arthroplasty with implant to maintain motion to the first MTP.  She is very healthy and active and maintaining motion to the great toe joint is a high priority postoperatively.  -Risk benefits advantages and disadvantages of the procedure were explained in detail to the patient.  No guarantees were expressed or implied.  All patient questions were answered.  After discussing with the patient she would like to proceed with surgery -Authorization for surgery was initiated today.  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 Dot Gazella, DPM    [address] [place], [place] [address]                Office [phone]  Fax [phone]

## 2023-06-30 ENCOUNTER — Telehealth: Payer: Self-pay | Admitting: Podiatry

## 2023-06-30 NOTE — Telephone Encounter (Signed)
 Received surgical consent form  Called pt and she is going to look at her calendar and call me back to schedule the surgery.

## 2023-07-09 ENCOUNTER — Other Ambulatory Visit: Payer: Self-pay

## 2023-07-09 ENCOUNTER — Emergency Department (HOSPITAL_COMMUNITY)

## 2023-07-09 ENCOUNTER — Emergency Department (HOSPITAL_COMMUNITY)
Admission: EM | Admit: 2023-07-09 | Discharge: 2023-07-09 | Disposition: A | Discharge status: Home / Self Care | Attending: Emergency Medicine | Admitting: Emergency Medicine

## 2023-07-09 ENCOUNTER — Encounter (HOSPITAL_COMMUNITY): Payer: Self-pay

## 2023-07-09 DIAGNOSIS — D72819 Decreased white blood cell count, unspecified: Secondary | ICD-10-CM | POA: Diagnosis not present

## 2023-07-09 DIAGNOSIS — J45909 Unspecified asthma, uncomplicated: Secondary | ICD-10-CM | POA: Diagnosis not present

## 2023-07-09 DIAGNOSIS — M79661 Pain in right lower leg: Secondary | ICD-10-CM | POA: Diagnosis not present

## 2023-07-09 DIAGNOSIS — R0789 Other chest pain: Secondary | ICD-10-CM | POA: Insufficient documentation

## 2023-07-09 DIAGNOSIS — R791 Abnormal coagulation profile: Secondary | ICD-10-CM | POA: Insufficient documentation

## 2023-07-09 DIAGNOSIS — R7989 Other specified abnormal findings of blood chemistry: Secondary | ICD-10-CM

## 2023-07-09 DIAGNOSIS — I82451 Acute embolism and thrombosis of right peroneal vein: Secondary | ICD-10-CM

## 2023-07-09 LAB — BASIC METABOLIC PANEL WITH GFR
Anion gap: 9 (ref 5–15)
BUN: 18 mg/dL (ref 6–20)
CO2: 27 mmol/L (ref 22–32)
Calcium: 9.1 mg/dL (ref 8.9–10.3)
Chloride: 102 mmol/L (ref 98–111)
Creatinine, Ser: 0.86 mg/dL (ref 0.44–1.00)
GFR, Estimated: >60 mL/min (ref >60)
Glucose, Bld: 107 mg/dL — ABNORMAL HIGH (ref 70–99)
Potassium: 3.4 mmol/L — ABNORMAL LOW (ref 3.5–5.1)
Sodium: 138 mmol/L (ref 135–145)

## 2023-07-09 LAB — TROPONIN I (HIGH SENSITIVITY)
Troponin I (High Sensitivity): <2 ng/L (ref <18)
Troponin I (High Sensitivity): <2 ng/L (ref <18)

## 2023-07-09 LAB — CBC
HCT: 42.4 % (ref 36.0–46.0)
Hemoglobin: 14.1 g/dL (ref 12.0–15.0)
MCH: 29.1 pg (ref 26.0–34.0)
MCHC: 33.3 g/dL (ref 30.0–36.0)
MCV: 87.4 fL (ref 80.0–100.0)
Platelets: 190 10*3/uL (ref 150–400)
RBC: 4.85 MIL/uL (ref 3.87–5.11)
RDW: 12.7 % (ref 11.5–15.5)
WBC: 2.9 10*3/uL — ABNORMAL LOW (ref 4.0–10.5)
nRBC: 0 % (ref 0.0–0.2)

## 2023-07-09 LAB — HCG, SERUM, QUALITATIVE: Preg, Serum: NEGATIVE

## 2023-07-09 LAB — D-DIMER, QUANTITATIVE: D-Dimer, Quant: 1.51 ug{FEU}/mL — ABNORMAL HIGH (ref 0.00–0.50)

## 2023-07-09 MED ORDER — LACTATED RINGERS IV BOLUS: 500.0000 mL | Freq: Once | INTRAVENOUS | Status: DC

## 2023-07-09 MED ORDER — APIXABAN (ELIQUIS) VTE STARTER PACK (10MG AND 5MG)
ORAL_TABLET | ORAL | 0 refills | Status: DC
Start: 2023-07-09 — End: 2023-07-09

## 2023-07-09 MED ORDER — DOXYCYCLINE HYCLATE 100 MG PO CAPS: 100.0000 mg | ORAL_CAPSULE | Freq: Two times a day (BID) | ORAL | 0 refills | Status: DC

## 2023-07-09 MED ORDER — APIXABAN 5 MG PO TABS
10.0000 mg | ORAL_TABLET | Freq: Once | ORAL | Status: AC
  Administered 2023-07-09: 10 mg via ORAL

## 2023-07-09 MED ORDER — IOHEXOL 350 MG/ML SOLN
50.0000 mL | Freq: Once | INTRAVENOUS | Status: AC | PRN
  Administered 2023-07-09: 50 mL via INTRAVENOUS

## 2023-07-09 MED ORDER — APIXABAN (ELIQUIS) VTE STARTER PACK (10MG AND 5MG): ORAL_TABLET | ORAL | 0 refills | Status: DC

## 2023-07-09 NOTE — ED Notes (Signed)
 Eliquis provided by pharmacist.

## 2023-07-09 NOTE — Progress Notes (Signed)
 VASCULAR LAB    Right lower extremity venous duplex has been performed.  See CV proc for preliminary results.  Relayed results to Dr. Emil via secure chat   RACHEL PELLET, RVT 07/09/2023, 5:05 PM

## 2023-07-09 NOTE — ED Provider Notes (Signed)
 [place] [facility] AT [facility] Provider Note   CSN: [id number] Arrival date & time: 07/09/23  9074     Patient presents with: Chest Pain   Maureen Velazquez is a 48 y.o. female with noncontributory past well history presents with complaints of chest heaviness, shortness of breath, nausea and chills over the past couple days.  Denies any chest pain.  She has no cardiac history.  No prior blood clots.  No vomiting or diarrhea.  No respiratory symptoms.    Chest Pain     Past Medical History:  Diagnosis Date   ADHD (attention deficit hyperactivity disorder), inattentive type    Allergic rhinitis due to pollen    Allergy    Asthma    Endometriosis    Past Surgical History:  Procedure Laterality Date   LAPAROSCOPIC OVARIAN CYSTECTOMY     MELANOMA EXCISION  07/2017   in situ---posterior neck   OVARIAN CYST REMOVAL Right ~2007     Prior to Admission medications   Medication Sig Start Date End Date Taking? Authorizing Provider  doxycycline (VIBRAMYCIN) 100 MG capsule Take 1 capsule (100 mg total) by mouth 2 (two) times daily. 07/09/23  Yes Donnajean Lynwood DEL, PA-C  MAGNESIUM GLYCINATE PO Take by mouth.    [provider]  Nutritional Supplements (ESTROVEN PO) Take by mouth.    [provider]    Allergies: Patient has no known allergies.    Review of Systems  Cardiovascular:  Positive for chest pain.    Updated Vital Signs BP 106/70 (BP Location: Right Arm)   Pulse 81   Temp 98.1 F (36.7 C) (Oral)   Resp 14   Ht 5' (1.524 m)   Wt 53.5 kg   LMP 07/01/2023 (Approximate)   SpO2 97%   BMI 23.05 kg/m   Physical Exam Vitals and nursing note reviewed.  Constitutional:      General: She is not in acute distress.    Appearance: She is well-developed.  HENT:     Head: Normocephalic and atraumatic.   Eyes:     Conjunctiva/sclera: Conjunctivae normal.    Cardiovascular:     Rate and Rhythm: Normal rate and regular rhythm.      Heart sounds: No murmur heard. Pulmonary:     Effort: Pulmonary effort is normal. No respiratory distress.     Breath sounds: Normal breath sounds.  Abdominal:     Palpations: Abdomen is soft.     Tenderness: There is no abdominal tenderness.   Musculoskeletal:        General: No swelling.     Cervical back: Neck supple.   Skin:    General: Skin is warm and dry.     Capillary Refill: Capillary refill takes less than 2 seconds.   Neurological:     Mental Status: She is alert.   Psychiatric:        Mood and Affect: Mood normal.     (all labs ordered are listed, but only abnormal results are displayed) Labs Reviewed  BASIC METABOLIC PANEL WITH GFR - Abnormal; Notable for the following components:      Result Value   Potassium 3.4 (*)    Glucose, Bld 107 (*)    All other components within normal limits  CBC - Abnormal; Notable for the following components:   WBC 2.9 (*)    All other components within normal limits  D-DIMER, QUANTITATIVE - Abnormal; Notable for the following components:   D-Dimer, Quant 1.51 (*)  All other components within normal limits  HCG, SERUM, QUALITATIVE  LYME DISEASE SEROLOGY W/REFLEX  TROPONIN I (HIGH SENSITIVITY)  TROPONIN I (HIGH SENSITIVITY)    EKG: None  Radiology: CT Angio Chest PE W and/or Wo Contrast Result Date: 07/09/2023 CLINICAL DATA:  Positive D-dimer. Low to intermediate probability for pulmonary embolism. Please evaluate. EXAM: CT ANGIOGRAPHY CHEST WITH CONTRAST TECHNIQUE: Multidetector CT imaging of the chest was performed using the standard protocol during bolus administration of intravenous contrast. Multiplanar CT image reconstructions and MIPs were obtained to evaluate the vascular anatomy. RADIATION DOSE REDUCTION: This exam was performed according to the departmental dose-optimization program which includes automated exposure control, adjustment of the mA and/or kV according to patient size and/or use of iterative  reconstruction technique. CONTRAST:  50mL OMNIPAQUE IOHEXOL 350 MG/ML SOLN COMPARISON:  CTA chest 09/21/2019 FINDINGS: Cardiovascular: Heart is normal size. Thoracic aorta is normal caliber. Normal 3 vessel takeoff from the aortic arch. Pulmonary arterial system is well opacified without evidence of emboli. Mediastinum/Nodes: Mediastinal structures are unremarkable. No evidence of adenopathy match that no evidence of mediastinal or hilar adenopathy. Lungs/Pleura: Lungs are adequately inflated without acute airspace consolidation or effusion. Interval resolution of the previously seen subpleural nodular foci over the lower lobes. Airways are normal. Upper Abdomen: No acute findings. Musculoskeletal: Normal. Review of the MIP images confirms the above findings. IMPRESSION: 1. No acute cardiopulmonary disease. No evidence of pulmonary emboli. 2. Interval resolution of the previously seen subpleural nodular foci over the lower lobes. Electronically Signed   By: Toribio Agreste M.D.   On: 07/09/2023 14:29   DG Chest 2 View Result Date: 07/09/2023 CLINICAL DATA:  Chest pain. EXAM: CHEST - 2 VIEW COMPARISON:  08/29/2019 FINDINGS: The heart size and mediastinal contours are within normal limits. Both lungs are clear. Nipple shadows incidentally noted overlying lung bases. The visualized skeletal structures are unremarkable. IMPRESSION: No active cardiopulmonary disease. Electronically Signed   By: Norleen DELENA Kil M.D.   On: 07/09/2023 10:19     Procedures   Medications Ordered in the ED  iohexol (OMNIPAQUE) 350 MG/ML injection 50 mL (50 mLs Intravenous Contrast Given 07/09/23 1407)    Clinical Course as of 07/09/23 1545  Sat Jul 09, 2023  1519 -multiple recent tick bites, no rash -chest heaviness/chills [ ]  DVT US  BLE -likely DC, doxy sent [TS]    Clinical Course User Index [TS] Sherrine Birmingham, DO                                 Medical Decision Making Amount and/or Complexity of Data Reviewed Labs:  ordered. Radiology: ordered.  Risk Prescription drug management.   This patient presents to the ED with chief complaint(s) of chest heaviness.  The complaint involves an extensive differential diagnosis and also carries with it a high risk of complications and morbidity.   Pertinent past medical history as listed in HPI  The differential diagnosis includes  ACS, PE, pneumonia, URI Additional history obtained: Records reviewed Care Everywhere/External Records  Assessment and management:   Hemodynamically stable, nontoxic-appearing patient presenting with chest heaviness, shortness of breath, nausea and chills.  Her symptoms are nonexertional.  She has no cardiac history.  No prior blood clots.  She is PERC negative.  Her workup is overall very reassuring.  Patient is concerned she has a blood clot.  She has no lower extremity edema.  No risk factors.  Will obtain a dimer.  Dimer positive will obtain CT angio PE.  CT negative.  Patient is concerned for DVT.  Will obtain Dopplers here in the ED.  Patient does report recent tick bites.  Reports that she pulled off multiple.  No rashes appreciated on exam.  Given the variety of symptoms that she is presenting with, using shared decision making agreed to treat empirically.  Will send in course of doxycycline.  At time of signout DVT studies are pending.  Independent ECG interpretation:  Sinus rhythm  Independent labs interpretation:  The following labs were independently interpreted:  Troponin without elevation, hCG negative, BMP without significant abnormality, CBC with leukopenia of 2.9, dimer elevated 1.5  Independent visualization and interpretation of imaging: I independently visualized the following imaging with scope of interpretation limited to determining acute life threatening conditions related to emergency care: Chest x-ray without cardiopulmonary disease   Consultations obtained:   none  Disposition:   Signout given to [employer], DO.  Please see her note for the remainder of the visit.  Disposition pending workup.  Social Determinants of Health:   none  This note was dictated with voice recognition software.  Despite best efforts at proofreading, errors may have occurred which can change the documentation meaning.       Final diagnoses:  Atypical chest pain  Positive D dimer    ED Discharge Orders          Ordered    doxycycline (VIBRAMYCIN) 100 MG capsule  2 times daily        07/09/23 1544               Donnajean Lynwood DEL, PA-C 07/09/23 1547    Emil Share, DO 07/10/23 [phone]

## 2023-07-09 NOTE — Discharge Instructions (Addendum)
 Prescription for Eliquis was sent to your pharmacy.  Please take as directed.  Please follow-up with your primary care doctor in the next week.  Please do not take NSAIDs (ibuprofen Advil Motrin) while [name] A prescription for doxycycline was sent into her pharmacy.  Please be sure to complete the full course of antibiotics.  Your Lyme testing is pending and can be viewed on your MyChart and reviewed by your PCP outpatient. Thank you for allowing us  to take care of you today.  We hope you begin feeling better soon.   To-Do:  Please follow-up with your primary doctor within the next 2-3 days. Please return to the Emergency Department or call 911 if you experience chest pain, shortness of breath, severe pain, severe fever, altered mental status, or have any reason to think that you need emergency medical care.  Thank you again.  Hope you feel better soon.  [facility] Department of Emergency Medicine

## 2023-07-09 NOTE — ED Notes (Signed)
D-dimer collected

## 2023-07-09 NOTE — ED Triage Notes (Addendum)
 Pt states she has had chest heaviness  for past few days. Pt states that for past day she has been feeling lightheaded and dizzy. Pt states her body temp dropped yesterday and she had chills. Pt has also had fatigue. Pt states she has been in woods recently but does not remember getting a tick or spider bite.

## 2023-07-09 NOTE — ED Provider Notes (Signed)
  Assume Care - Medical Decision Making  Care of patient assumed from previous emergency medicine provider. See their note for further details of history, physical exam and plan.  Briefly, Maureen Velazquez is a 48 y.o. female who presents with chest heaviness, shortness of breath, nausea and chills over the past couple days. Denies any chest pain. She has no cardiac history. No prior blood clots. No vomiting or diarrhea. No respiratory symptoms.  She also reports multiple tick bites.  Plan at time of Handoff: - Prophylactic doxycycline sent to patient's pharmacy.  Follow-up DVT studies  I personally reassessed the patient: No acute distress pain controlled  Vitals:   07/09/23 1508 07/09/23 1823  BP: 106/70 107/72  Pulse: 81 72  Resp: 14 14  Temp: 98.1 F (36.7 C) 98.5 F (36.9 C)  SpO2: 97% 100%   Physical Exam  BP (!) 135/91   Pulse 87   Temp 98.1 F (36.7 C) (Oral)   Resp 16   SpO2 100%     Additional MDM/ED Course: Lyme titer and recommended spotted fever titer sent. DVT US  revealed findings consistent with acute deep vein thrombosis involving the right proximal to mid posterior tibial and peroneal veins, and at the Tibioperoneal trunk.  Patient received 1 dose of Eliquis here and Eliquis starter pack sent to pharmacy. Patient stable for discharge outpatient follow-up   Waddell Seats, DO PGY-3 Emergency Medicine    Seats Waddell, DO 07/09/23 2036    Emil Share, DO 07/10/23 [phone]

## 2023-07-11 ENCOUNTER — Encounter: Payer: Self-pay | Admitting: Podiatry

## 2023-07-11 LAB — LYME DISEASE SEROLOGY W/REFLEX: Lyme Total Antibody EIA: NEGATIVE

## 2023-07-12 ENCOUNTER — Emergency Department (HOSPITAL_BASED_OUTPATIENT_CLINIC_OR_DEPARTMENT_OTHER)

## 2023-07-12 ENCOUNTER — Encounter (HOSPITAL_BASED_OUTPATIENT_CLINIC_OR_DEPARTMENT_OTHER): Payer: Self-pay | Admitting: Emergency Medicine

## 2023-07-12 ENCOUNTER — Other Ambulatory Visit: Payer: Self-pay

## 2023-07-12 ENCOUNTER — Emergency Department (HOSPITAL_BASED_OUTPATIENT_CLINIC_OR_DEPARTMENT_OTHER)
Admission: EM | Admit: 2023-07-12 | Discharge: 2023-07-12 | Disposition: A | Discharge status: Home / Self Care | Attending: Emergency Medicine | Admitting: Emergency Medicine

## 2023-07-12 DIAGNOSIS — R0602 Shortness of breath: Secondary | ICD-10-CM | POA: Insufficient documentation

## 2023-07-12 DIAGNOSIS — R079 Chest pain, unspecified: Secondary | ICD-10-CM

## 2023-07-12 DIAGNOSIS — R0789 Other chest pain: Secondary | ICD-10-CM | POA: Insufficient documentation

## 2023-07-12 DIAGNOSIS — Z7901 Long term (current) use of anticoagulants: Secondary | ICD-10-CM | POA: Insufficient documentation

## 2023-07-12 DIAGNOSIS — J45909 Unspecified asthma, uncomplicated: Secondary | ICD-10-CM | POA: Insufficient documentation

## 2023-07-12 LAB — COMPREHENSIVE METABOLIC PANEL WITH GFR
ALT: 15 U/L (ref 0–44)
AST: 35 U/L (ref 15–41)
Albumin: 4.2 g/dL (ref 3.5–5.0)
Alkaline Phosphatase: 60 U/L (ref 38–126)
Anion gap: 11 (ref 5–15)
BUN: 21 mg/dL — ABNORMAL HIGH (ref 6–20)
CO2: 26 mmol/L (ref 22–32)
Calcium: 10 mg/dL (ref 8.9–10.3)
Chloride: 102 mmol/L (ref 98–111)
Creatinine, Ser: 0.83 mg/dL (ref 0.44–1.00)
GFR, Estimated: >60 mL/min (ref >60)
Glucose, Bld: 95 mg/dL (ref 70–99)
Potassium: 4 mmol/L (ref 3.5–5.1)
Sodium: 139 mmol/L (ref 135–145)
Total Bilirubin: 0.3 mg/dL (ref 0.0–1.2)
Total Protein: 7.5 g/dL (ref 6.5–8.1)

## 2023-07-12 LAB — CBC WITH DIFFERENTIAL/PLATELET
Abs Immature Granulocytes: 0.01 10*3/uL (ref 0.00–0.07)
Basophils Absolute: 0 10*3/uL (ref 0.0–0.1)
Basophils Relative: 1 %
Eosinophils Absolute: 0 10*3/uL (ref 0.0–0.5)
Eosinophils Relative: 1 %
HCT: 39 % (ref 36.0–46.0)
Hemoglobin: 13.3 g/dL (ref 12.0–15.0)
Immature Granulocytes: 0 %
Lymphocytes Relative: 28 %
Lymphs Abs: 1.3 10*3/uL (ref 0.7–4.0)
MCH: 28.9 pg (ref 26.0–34.0)
MCHC: 34.1 g/dL (ref 30.0–36.0)
MCV: 84.6 fL (ref 80.0–100.0)
Monocytes Absolute: 0.4 10*3/uL (ref 0.1–1.0)
Monocytes Relative: 8 %
Neutro Abs: 2.9 10*3/uL (ref 1.7–7.7)
Neutrophils Relative %: 62 %
Platelets: 231 10*3/uL (ref 150–400)
RBC: 4.61 MIL/uL (ref 3.87–5.11)
RDW: 12.5 % (ref 11.5–15.5)
WBC: 4.7 10*3/uL (ref 4.0–10.5)
nRBC: 0 % (ref 0.0–0.2)

## 2023-07-12 LAB — I-STAT CHEM 8, ED
BUN: 33 mg/dL — ABNORMAL HIGH (ref 6–20)
Calcium, Ion: 1.18 mmol/L (ref 1.15–1.40)
Chloride: 100 mmol/L (ref 98–111)
Creatinine, Ser: 0.9 mg/dL (ref 0.44–1.00)
Glucose, Bld: 92 mg/dL (ref 70–99)
HCT: 41 % (ref 36.0–46.0)
Hemoglobin: 13.9 g/dL (ref 12.0–15.0)
Potassium: 4.3 mmol/L (ref 3.5–5.1)
Sodium: 139 mmol/L (ref 135–145)
TCO2: 26 mmol/L (ref 22–32)

## 2023-07-12 LAB — RESP PANEL BY RT-PCR (RSV, FLU A&B, COVID)  RVPGX2
Influenza A by PCR: NEGATIVE
Influenza B by PCR: NEGATIVE
Resp Syncytial Virus by PCR: NEGATIVE
SARS Coronavirus 2 by RT PCR: NEGATIVE

## 2023-07-12 LAB — TROPONIN T, HIGH SENSITIVITY: Troponin T High Sensitivity: <15 ng/L (ref <19)

## 2023-07-12 LAB — PREGNANCY, URINE: Preg Test, Ur: NEGATIVE

## 2023-07-12 LAB — PRO BRAIN NATRIURETIC PEPTIDE: Pro Brain Natriuretic Peptide: 69.8 pg/mL (ref <300.0)

## 2023-07-12 MED ORDER — IOHEXOL 350 MG/ML SOLN
75.0000 mL | Freq: Once | INTRAVENOUS | Status: AC | PRN
  Administered 2023-07-12: 75 mL via INTRAVENOUS

## 2023-07-12 NOTE — ED Triage Notes (Signed)
 Chest tightness x 3 days , fount right lower leg DVT , [name]  since then . Persistent chest tightness and shortness of breath.

## 2023-07-12 NOTE — ED Provider Notes (Signed)
 [place] [facility] AT [facility] Provider Note   CSN: [id number] Arrival date & time: 07/12/23  1536     Patient presents with: Shortness of Breath   Maureen Velazquez is a 48 y.o. female.   48 year old female with history of recently diagnosed DVT and asthma who presents to the emergency department with chest pressure.  Patient reports that for the past 3 days she has had chest tightness.  Worsened with deep breathing.  No diaphoresis or vomiting.  Not exertional.  Does not radiate.  Currently 7/10 in severity.  Also is having some shortness of breath.  Says that she feels generally weak as well.  3 weeks ago found several ticks on her that she had to pull out.  Thinks that she may have had a rash at one point in time.  Had a Lyme test that was sent 3 days ago when she was diagnosed with a DVT and started [name].  Was prophylactically started on doxycycline as well.       Prior to Admission medications   Medication Sig Start Date End Date Taking? Authorizing Provider  APIXABAN (ELIQUIS) VTE STARTER PACK (10MG  AND 5MG ) Take as directed on package: start with two-5mg  tablets twice daily for 7 days. On day 8, switch to one-5mg  tablet twice daily. 07/09/23   Sherrine Birmingham, DO  doxycycline (VIBRAMYCIN) 100 MG capsule Take 1 capsule (100 mg total) by mouth 2 (two) times daily. 07/09/23   Sherrine Birmingham, DO  MAGNESIUM GLYCINATE PO Take by mouth.    [provider]  Nutritional Supplements (ESTROVEN PO) Take by mouth.    [provider]    Allergies: Patient has no known allergies.    Review of Systems  Updated Vital Signs BP 106/80 (BP Location: Left Arm)   Pulse 80   Temp 97.8 F (36.6 C) (Oral)   Resp 17   Wt 53 kg   LMP 07/01/2023 (Approximate)   SpO2 100%   BMI 22.82 kg/m   Physical Exam Vitals and nursing note reviewed.  Constitutional:      General: She is not in acute distress.    Appearance: She is well-developed.  HENT:      Head: Normocephalic and atraumatic.     Right Ear: External ear normal.     Left Ear: External ear normal.     Nose: Nose normal.  Eyes:     Extraocular Movements: Extraocular movements intact.     Conjunctiva/sclera: Conjunctivae normal.     Pupils: Pupils are equal, round, and reactive to light.  Cardiovascular:     Rate and Rhythm: Normal rate and regular rhythm.     Heart sounds: No murmur heard.    Comments: Radial pulses 2+ bilaterally.  Chest pain not reproducible. Pulmonary:     Effort: Pulmonary effort is normal. No respiratory distress.     Breath sounds: Normal breath sounds.  Musculoskeletal:     Cervical back: Normal range of motion and neck supple.     Right lower leg: No edema.     Left lower leg: No edema.     Comments: DP pulses 2+ bilaterally  Skin:    General: Skin is warm and dry.  Neurological:     Mental Status: She is alert and oriented to person, place, and time. Mental status is at baseline.  Psychiatric:        Mood and Affect: Mood normal.     (all labs ordered are listed, but only abnormal  results are displayed) Labs Reviewed  COMPREHENSIVE METABOLIC PANEL WITH GFR - Abnormal; Notable for the following components:      Result Value   BUN 21 (*)    All other components within normal limits  I-STAT CHEM 8, ED - Abnormal; Notable for the following components:   BUN 33 (*)    All other components within normal limits  RESP PANEL BY RT-PCR (RSV, FLU A&B, COVID)  RVPGX2  PREGNANCY, URINE  CBC WITH DIFFERENTIAL/PLATELET  PRO BRAIN NATRIURETIC PEPTIDE  SPOTTED FEVER GROUP ANTIBODIES  TROPONIN T, HIGH SENSITIVITY    EKG: EKG Interpretation Date/Time:  Tuesday July 12 2023 15:43:41 EDT Ventricular Rate:  81 PR Interval:  141 QRS Duration:  85 QT Interval:  371 QTC Calculation: 431 R Axis:   53  Text Interpretation: Sinus rhythm Probable left atrial enlargement Confirmed by Yolande Charleston [phone]) on 07/12/2023 3:47:12 PM  Radiology: CT  Angio Chest PE W and/or Wo Contrast Result Date: 07/12/2023 CLINICAL DATA:  Chest pain and tightness for the past 3 days. Right lower leg DVT treated with Eliquis. EXAM: CT ANGIOGRAPHY CHEST WITH CONTRAST TECHNIQUE: Multidetector CT imaging of the chest was performed using the standard protocol during bolus administration of intravenous contrast. Multiplanar CT image reconstructions and MIPs were obtained to evaluate the vascular anatomy. RADIATION DOSE REDUCTION: This exam was performed according to the departmental dose-optimization program which includes automated exposure control, adjustment of the mA and/or kV according to patient size and/or use of iterative reconstruction technique. CONTRAST:  75mL OMNIPAQUE IOHEXOL 350 MG/ML SOLN COMPARISON:  07/09/2023. FINDINGS: Cardiovascular: Satisfactory opacification of the pulmonary arteries to the segmental level. No evidence of pulmonary embolism. Normal heart size. No pericardial effusion. Mediastinum/Nodes: No enlarged mediastinal, hilar, or axillary lymph nodes. Thyroid gland, trachea, and esophagus demonstrate no significant findings. Lungs/Pleura: Lungs are clear. No pleural effusion or pneumothorax. Upper Abdomen: Unremarkable. Musculoskeletal: Minimal thoracic spine degenerative changes. Review of the MIP images confirms the above findings. IMPRESSION: No evidence of pulmonary embolism or other acute abnormality. Electronically Signed   By: Elspeth Bathe M.D.   On: 07/12/2023 17:55     Procedures   Medications Ordered in the ED  iohexol (OMNIPAQUE) 350 MG/ML injection 75 mL (75 mLs Intravenous Contrast Given 07/12/23 1733)                                    Medical Decision Making Amount and/or Complexity of Data Reviewed Labs: ordered. Radiology: ordered.  Risk Prescription drug management.   Maureen Velazquez is a 48 y.o. female with comorbidities that complicate the patient evaluation including recently diagnosed DVT and asthma who presents  emergency department chest pressure  Initial Ddx:  PE, MI, anxiety, pneumonia, tickborne illness  MDM/Course:  Patient presents emergency department with chest discomfort.  Was recently diagnosed with a DVT.  Chest pain is pleuritic.  Is not exertional.  Is satting well on room air and does not appear to be in any acute distress.  EKG showed sinus rhythm.  Serial troponins WNL.  She had a CTA that did not show evidence of PE.  Remainder of her lab work was reassuring.  She stating that she is still having some fatigue and is worried about a possible tickborne illness.  I do not see a rash.  She is not having any fevers.  She already had a negative Lyme test and is on doxycycline.  [facility]  spotted fever antibody was sent.  She was also requesting thyroid function tests to be sent to help facilitate her workup as an outpatient but do not see any signs of myxedema coma or any other signs of severe hypothyroidism.  Will have her continue her workup with her primary doctor but I do not see any life-threatening etiology of her symptoms today  This patient presents to the ED for concern of complaints listed in HPI, this involves an extensive number of treatment options, and is a complaint that carries with it a high risk of complications and morbidity. Disposition including potential need for admission considered.   Dispo: DC Home. Return precautions discussed including, but not limited to, those listed in the AVS. Allowed pt time to ask questions which were answered fully prior to dc.  Additional history obtained from spouse Records reviewed ED Visit Notes The following labs were independently interpreted: Chemistry and show no acute abnormality I independently reviewed the following imaging with scope of interpretation limited to determining acute life threatening conditions related to emergency care: CT Chest and agree with the radiologist interpretation with the following exceptions: none I  personally reviewed and interpreted cardiac monitoring: normal sinus rhythm  I personally reviewed and interpreted the pt's EKG: see above for interpretation  I have reviewed the patients home medications and made adjustments as needed  Portions of this note were generated with Dragon dictation software. Dictation errors may occur despite best attempts at proofreading.     Final diagnoses:  Chest pain, unspecified type    ED Discharge Orders     None          Yolande Lamar BROCKS, MD 07/12/23 2311

## 2023-07-12 NOTE — Discharge Instructions (Addendum)
 You were seen for your chest pain in the emergency department.   At home, please take tylenol and ibuprofen for your pain.    Follow-up with your primary doctor in 2-3 days regarding your visit.   Return immediately to the emergency department if you experience any of the following: Worsening pain, difficulty breathing, unexplained vomiting or sweating, or any other concerning symptoms.    Thank you for visiting our Emergency Department. It was a pleasure taking care of you today.

## 2023-07-12 NOTE — ED Notes (Signed)
 Patient requesting covid swab per nurse, placing order per protocol.

## 2023-07-13 ENCOUNTER — Telehealth: Payer: Self-pay

## 2023-07-13 DIAGNOSIS — I82451 Acute embolism and thrombosis of right peroneal vein: Secondary | ICD-10-CM

## 2023-07-13 DIAGNOSIS — Z862 Personal history of diseases of the blood and blood-forming organs and certain disorders involving the immune mechanism: Secondary | ICD-10-CM

## 2023-07-13 NOTE — Telephone Encounter (Signed)
-----   Message from Maureen Velazquez sent at 07/12/2023  4:44 PM EDT ----- Thanks Maureen skates !!! ----- Message ----- From: Maureen Maureen FERNS, MD Sent: 07/12/2023  10:36 AM EDT To: Maureen JAYSON Emmer, MD  I don't retire for 2 more months (but I am ready!) I will have my staff call to set up an ER follow up Agree with hematology evaluation ----- Message ----- From: Velazquez Maureen JAYSON, MD Sent: 07/12/2023  10:03 AM EDT To: Maureen FORBES Ring, MD; Maureen Velazquez [name], MD; Pet#  Needs referral to hematology for unprovoked DVT ? Hypercoagulable She has seen [facility] Dr Maureen and [facility] in past but said her primary retired Call their office and let them know she needs to be seen by another doctor after her ER visit. Nothing cardiac is going on Her husband Maureen Velazquez is my patient and called me

## 2023-07-13 NOTE — Telephone Encounter (Signed)
 Placed order for referral to hematology for unprovoked DVT and hypercoagulable.   Called patient about referral. Patient has a new PCP Dr. Dorn Pouch. They did an EKG and want her to see a cardiologist. Asked her to have them put in referral to our office. Made patient a new patient appointment with Dr. Delford. Will send message to chart prep to get records.

## 2023-07-13 NOTE — Progress Notes (Signed)
 CARDIOLOGY CONSULT NOTE       Patient ID: Maureen Velazquez MRN: [id number] DOB/AGE: 1975-02-21 48 y.o.  Admit date: (Not on file) Referring Physician: [name] Primary Physician: Maureen Velazquez BRAVO, MD Primary Cardiologist: New Reason for Consultation:DVT     HPI:  48 y.o. referred by Dr Maureen Velazquez for unprovoked DVT. She is the wife of my patient Maureen Velazquez. Diagnosed with DVT 6/28 and back in ER 3 days latter. CTA  6/28 negative for PE. CXR NAD Seen in ED 07/12/23 for 3 days chest tightness. Worse with deep breathing Not exertional associated with dyspnea Has had multiple recent tick bites. ? Rash at some point 3 days prior to this she was started on eliquis  for her DVT and doxycycline  Lyme serology negative Troponin and BNP normal WBC supressed 2.9 Respiratory panel negative including influenza RSV and COVID   Review of her PE study showed normal coronary origins and minimal calcium in ostium of RCA/LAD. No aortic pathology  Prior to this she is very active and works out daily Does spin classes and aerobics    ROS All other systems reviewed and negative except as noted above  Past Medical History:  Diagnosis Date   ADHD (attention deficit hyperactivity disorder), inattentive type    Allergic rhinitis due to pollen    Allergy    Asthma    Endometriosis     Family History  Problem Relation Age of Onset   Hypertension Mother    Heart disease Mother        coronary stents   Hyperlipidemia Mother    Hypertension Father    Hyperlipidemia Father    Melanoma Father    Cancer Maternal Grandmother        colon    Cancer Maternal Grandfather        colon   Hyperlipidemia Paternal Grandmother    Heart disease Paternal Grandfather    Stroke Paternal Grandfather    Parkinson's disease Paternal Grandfather    Diabetes Neg Hx     Social History   Socioeconomic History   Marital status: Married    Spouse name: Not on file   Number of children: 2   Years of education: Not on file    Highest education level: Not on file  Occupational History   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  Tobacco Use   Smoking status: Never    Passive exposure: Past   Smokeless tobacco: Never  Vaping Use   Vaping status: Never Used  Substance and Sexual Activity   Alcohol use: Not Currently    Comment: occ wine   Drug use: No   Sexual activity: Not on file  Other Topics Concern   Not on file  Social History Narrative   Not on file   Social Drivers of Health   Financial Resource Strain: Low Risk  (02/22/2023)   Received from [facility]   Overall Financial Resource Strain (CARDIA)    Difficulty of Paying Living Expenses: Not very hard  Food Insecurity: No Food Insecurity (02/22/2023)   Received from [facility]   Hunger Vital Sign    Within the past 12 months, you worried that your food would run out before you got the money to buy more.: Never true    Within the past 12 months, the food you bought just didn't last and you didn't have money to get more.: Never true  Transportation Needs: No Transportation Needs (02/22/2023)   Received  from [facility] - Transportation    In the past 12 months, has lack of transportation kept you from medical appointments or from getting medications?: No    Lack of Transportation (Non-Medical): No  Physical Activity: Not on file  Stress: Not on file  Social Connections: Not on file  Intimate Partner Violence: Not on file    Past Surgical History:  Procedure Laterality Date   LAPAROSCOPIC OVARIAN CYSTECTOMY     MELANOMA EXCISION  07/2017   in situ---posterior neck   OVARIAN CYST REMOVAL Right ~2007      Current Outpatient Medications:    APIXABAN  (ELIQUIS ) VTE STARTER PACK (10MG  AND 5MG ), Take as directed on package: start with two-5mg  tablets twice daily for 7 days. On day 8, switch to one-5mg  tablet twice daily., Disp: 74 each, Rfl: 0    doxycycline  (VIBRAMYCIN ) 100 MG capsule, Take 1 capsule (100 mg total) by mouth 2 (two) times daily., Disp: 20 capsule, Rfl: 0   Nutritional Supplements (ESTROVEN PO), Take by mouth., Disp: , Rfl:    MAGNESIUM GLYCINATE PO, Take by mouth. (Patient not taking: Reported on 07/20/2023), Disp: , Rfl:     Physical Exam: Blood pressure 92/62, pulse 76, height 5' 1 (1.549 m), weight 115 lb 12.8 oz (52.5 kg), last menstrual period 07/01/2023, SpO2 98%.    Affect appropriate Healthy:  appears stated age HEENT: normal Neck supple with no adenopathy JVP normal no bruits no thyromegaly Lungs clear with no wheezing and good diaphragmatic motion Heart:  S1/S2 no murmur, no rub, gallop or click PMI normal Abdomen: benighn, BS positve, no tenderness, no AAA no bruit.  No HSM or HJR Distal pulses intact with no bruits No edema Neuro non-focal Skin warm and dry No muscular weakness   Labs:   Lab Results  Component Value Date   WBC 4.7 07/12/2023   HGB 13.9 07/12/2023   HCT 41.0 07/12/2023   MCV 84.6 07/12/2023   PLT 231 07/12/2023    No results for input(s): NA, K, CL, CO2, BUN, CREATININE, CALCIUM, PROT, BILITOT, ALKPHOS, ALT, AST, GLUCOSE in the last 168 hours.  Invalid input(s): LABALBU  No results found for: CKTOTAL, CKMB, CKMBINDEX, TROPONINI No results found for: CHOL No results found for: HDL No results found for: LDLCALC No results found for: TRIG No results found for: CHOLHDL No results found for: LDLDIRECT    Radiology: CT Angio Chest PE W and/or Wo Contrast Result Date: 07/12/2023 CLINICAL DATA:  Chest pain and tightness for the past 3 days. Right lower leg DVT treated with Eliquis . EXAM: CT ANGIOGRAPHY CHEST WITH CONTRAST TECHNIQUE: Multidetector CT imaging of the chest was performed using the standard protocol during bolus administration of intravenous contrast. Multiplanar CT image reconstructions and MIPs were obtained to  evaluate the vascular anatomy. RADIATION DOSE REDUCTION: This exam was performed according to the departmental dose-optimization program which includes automated exposure control, adjustment of the mA and/or kV according to patient size and/or use of iterative reconstruction technique. CONTRAST:  75mL OMNIPAQUE  IOHEXOL  350 MG/ML SOLN COMPARISON:  07/09/2023. FINDINGS: Cardiovascular: Satisfactory opacification of the pulmonary arteries to the segmental level. No evidence of pulmonary embolism. Normal heart size. No pericardial effusion. Mediastinum/Nodes: No enlarged mediastinal, hilar, or axillary lymph nodes. Thyroid gland, trachea, and esophagus demonstrate no significant findings. Lungs/Pleura: Lungs are clear. No pleural effusion or pneumothorax. Upper Abdomen: Unremarkable. Musculoskeletal: Minimal thoracic spine degenerative changes. Review of the MIP images confirms the above findings. IMPRESSION: No evidence of  pulmonary embolism or other acute abnormality. Electronically Signed   By: Elspeth Bathe M.D.   On: 07/12/2023 17:55   VAS US  LOWER EXTREMITY VENOUS (DVT) (7a-7p) Result Date: 07/10/2023  Lower Venous DVT Study Patient Name:  Maureen Velazquez  Date of Exam:   07/09/2023 Medical Rec #: [id number]       Accession #:    [id number] Date of Birth: Jul 14, 1975        Patient Gender: F Patient Age:   36 years Exam Location:  [facility] Procedure:      VAS US  LOWER EXTREMITY VENOUS (DVT) Referring Phys: LYNWOOD TEMPLETON --------------------------------------------------------------------------------  Indications: Pain, Swelling, SOB, and Elevated D-Dimer. Negative for PE by CTA  Comparison Study: No prior study on file Performing Technologist: Alberta Lis RVS  Examination Guidelines: A complete evaluation includes B-mode imaging, spectral Doppler, color Doppler, and power Doppler as needed of all accessible portions of each vessel. Bilateral testing is considered an integral part of a complete  examination. Limited examinations for reoccurring indications may be performed as noted. The reflux portion of the exam is performed with the patient in reverse Trendelenburg.  +---------+---------------+---------+-----------+----------+--------------+ RIGHT    CompressibilityPhasicitySpontaneityPropertiesThrombus Aging +---------+---------------+---------+-----------+----------+--------------+ CFV      Full           Yes      Yes                                 +---------+---------------+---------+-----------+----------+--------------+ SFJ      Full                                                        +---------+---------------+---------+-----------+----------+--------------+ FV Prox  Full                                                        +---------+---------------+---------+-----------+----------+--------------+ FV Mid   Full                                                        +---------+---------------+---------+-----------+----------+--------------+ FV DistalFull                                                        +---------+---------------+---------+-----------+----------+--------------+ PFV      Full                                                        +---------+---------------+---------+-----------+----------+--------------+ POP      Full           Yes      Yes                                 +---------+---------------+---------+-----------+----------+--------------+  PTV      None                                         Acute          +---------+---------------+---------+-----------+----------+--------------+ PERO     None                                         Acute          +---------+---------------+---------+-----------+----------+--------------+ Gastroc  Full                                                        +---------+---------------+---------+-----------+----------+--------------+ TP trunk                                               Acute          +---------+---------------+---------+-----------+----------+--------------+   +----+---------------+---------+-----------+----------+--------------+ LEFTCompressibilityPhasicitySpontaneityPropertiesThrombus Aging +----+---------------+---------+-----------+----------+--------------+ CFV Full           Yes      Yes                                 +----+---------------+---------+-----------+----------+--------------+    Summary: RIGHT: - Findings consistent with acute deep vein thrombosis involving the right proximal to mid posterior tibial and peroneal veins, and at the Tibioperoneal trunk.  - No cystic structure found in the popliteal fossa.  LEFT: - No evidence of common femoral vein obstruction.   *See table(s) above for measurements and observations. Electronically signed by Norman Serve on 07/10/2023 at 8:51:43 AM.    Final    CT Angio Chest PE W and/or Wo Contrast Result Date: 07/09/2023 CLINICAL DATA:  Positive D-dimer. Low to intermediate probability for pulmonary embolism. Please evaluate. EXAM: CT ANGIOGRAPHY CHEST WITH CONTRAST TECHNIQUE: Multidetector CT imaging of the chest was performed using the standard protocol during bolus administration of intravenous contrast. Multiplanar CT image reconstructions and MIPs were obtained to evaluate the vascular anatomy. RADIATION DOSE REDUCTION: This exam was performed according to the departmental dose-optimization program which includes automated exposure control, adjustment of the mA and/or kV according to patient size and/or use of iterative reconstruction technique. CONTRAST:  50mL OMNIPAQUE  IOHEXOL  350 MG/ML SOLN COMPARISON:  CTA chest 09/21/2019 FINDINGS: Cardiovascular: Heart is normal size. Thoracic aorta is normal caliber. Normal 3 vessel takeoff from the aortic arch. Pulmonary arterial system is well opacified without evidence of emboli. Mediastinum/Nodes: Mediastinal structures are  unremarkable. No evidence of adenopathy match that no evidence of mediastinal or hilar adenopathy. Lungs/Pleura: Lungs are adequately inflated without acute airspace consolidation or effusion. Interval resolution of the previously seen subpleural nodular foci over the lower lobes. Airways are normal. Upper Abdomen: No acute findings. Musculoskeletal: Normal. Review of the MIP images confirms the above findings. IMPRESSION: 1. No acute cardiopulmonary disease. No evidence of pulmonary emboli. 2. Interval resolution of the previously seen subpleural nodular foci over the lower lobes. Electronically Signed   By: Toribio Agreste  M.D.   On: 07/09/2023 14:29   DG Chest 2 View Result Date: 07/09/2023 CLINICAL DATA:  Chest pain. EXAM: CHEST - 2 VIEW COMPARISON:  08/29/2019 FINDINGS: The heart size and mediastinal contours are within normal limits. Both lungs are clear. Nipple shadows incidentally noted overlying lung bases. The visualized skeletal structures are unremarkable. IMPRESSION: No active cardiopulmonary disease. Electronically Signed   By: Norleen DELENA Kil M.D.   On: 07/09/2023 10:19   DG Foot Complete Right Result Date: 06/22/2023 Please see detailed radiograph report in office note.   EKG: SR rate 81 normal    ASSESSMENT AND PLAN:   Chest pain: atypical seems to be related to ? Viral bronchial problem Despite DVT PE study negative no parenchymal lung dx, Resp panel negative, BNP normal and ECG normal no signs of pericarditis. Will order echo and ESR/CRP. Given recent CT scans normal ECG and atypical pain would not repeat CT cardiac or nuclear study She is active with normal ECG will order POET/ETT Tick Bite:  finishing course of doxycycline  Lyme titer negative Could have other rickettsial dx f/u primary spotted fever pending  DVT:  on eliquis  can't find report for US  ER note indicates acute DVT in right proximal to mid PT/Peroneal veins at the TP trunk  Unprovoked refer to hematology for  hypercoagulable w/u. Primary to decide on length of Rx ? 6 months   ESR/CRP Echo ETT  F/U post testing  Signed: [name] 07/20/2023, 10:04 AM

## 2023-07-14 ENCOUNTER — Telehealth: Payer: Self-pay | Admitting: Podiatry

## 2023-07-14 LAB — SPOTTED FEVER GROUP ANTIBODIES
Spotted Fever Group IgG: <1:64 {titer}
Spotted Fever Group IgM: <1:64 {titer}

## 2023-07-14 NOTE — Telephone Encounter (Signed)
 Called pt from message thru her my chart.  We have canceled her surgery for now and she will call to r/s.  She stated she ended up going back to the er again.   I cxled all post ops and sent email to [facility] to cancel the surgery.

## 2023-07-20 ENCOUNTER — Other Ambulatory Visit: Payer: Self-pay

## 2023-07-20 ENCOUNTER — Encounter: Payer: Self-pay | Admitting: Cardiovascular Disease

## 2023-07-20 ENCOUNTER — Ambulatory Visit: Attending: Cardiovascular Disease | Admitting: Cardiovascular Disease

## 2023-07-20 VITALS — BP 92/62 | HR 76 | Ht 61.0 in | Wt 115.8 lb

## 2023-07-20 DIAGNOSIS — I82451 Acute embolism and thrombosis of right peroneal vein: Secondary | ICD-10-CM

## 2023-07-20 DIAGNOSIS — R079 Chest pain, unspecified: Secondary | ICD-10-CM

## 2023-07-20 NOTE — Patient Instructions (Addendum)
 Medication Instructions:  Your physician recommends that you continue on your current medications as directed. Please refer to the Current Medication list given to you today.  *If you need a refill on your cardiac medications before your next appointment, please call your pharmacy*  Lab Work: Your physician recommends that you have lab work today- ESR/CRP  If you have labs (blood work) drawn today and your tests are completely normal, you will receive your results only by: MyChart Message (if you have MyChart) OR A paper copy in the mail If you have any lab test that is abnormal or we need to change your treatment, we will call you to review the results.  Testing/Procedures: Your physician has requested that you have an echocardiogram. Echocardiography is a painless test that uses sound waves to create images of your heart. It provides your doctor with information about the size and shape of your heart and how well your heart's chambers and valves are working. This procedure takes approximately one hour. There are no restrictions for this procedure. Please do NOT wear cologne, perfume, aftershave, or lotions (deodorant is allowed). Please arrive 15 minutes prior to your appointment time.  Please note: We ask at that you not bring children with you during ultrasound (echo/ vascular) testing. Due to room size and safety concerns, children are not allowed in the ultrasound rooms during exams. Our front office staff cannot provide observation of children in our lobby area while testing is being conducted. An adult accompanying a patient to their appointment will only be allowed in the ultrasound room at the discretion of the ultrasound technician under special circumstances. We apologize for any inconvenience. Your physician has requested that you have an exercise tolerance test. For further information please visit [email or web address] Please also follow instruction sheet, as  given.   Follow-Up: At [facility], you and your health needs are our priority.  As part of our continuing mission to provide you with exceptional heart care, our providers are all part of one team.  This team includes your primary Cardiologist (physician) and Advanced Practice Providers or APPs (Physician Assistants and Nurse Practitioners) who all work together to provide you with the care you need, when you need it.  Your next appointment:   3 month(s)  Provider:   [name], MD    We recommend signing up for the patient portal called MyChart.  Sign up information is provided on this After Visit Summary.  MyChart is used to connect with patients for Virtual Visits (Telemedicine).  Patients are able to view lab/test results, encounter notes, upcoming appointments, etc.  Non-urgent messages can be sent to your provider as well.   To learn more about what you can do with MyChart, go to [email or web address].   Other Instructions                         Patient Instructions for Stress Test  Medication instructions:   Do NOT take your -You do not need to hold any medications  Do not eat, drink or use tobacco products four hours prior to the test.  Water is ok.  3.  Dress prepared to exercise in a comfortable, two piece clothing outfit and walking shoes.  4.  Bring any current prescription medications with you the day of the test.  5.  Notify the office 24 hours in advance if you cannot keep this appointment.  6.  If you have any questions, please call [phone].

## 2023-07-20 NOTE — Progress Notes (Unsigned)
Place order for attestation for Dr. Johnsie Cancel to sign

## 2023-07-21 ENCOUNTER — Ambulatory Visit: Payer: Self-pay | Admitting: Cardiovascular Disease

## 2023-07-21 LAB — SEDIMENTATION RATE: Sed Rate: 3 mm/h (ref 0–32)

## 2023-07-21 LAB — C-REACTIVE PROTEIN: CRP: <1 mg/L (ref 0–10)

## 2023-07-22 ENCOUNTER — Inpatient Hospital Stay

## 2023-07-22 ENCOUNTER — Inpatient Hospital Stay: Attending: Internal Medicine | Admitting: Internal Medicine

## 2023-07-22 ENCOUNTER — Encounter: Payer: Self-pay | Admitting: Internal Medicine

## 2023-07-22 VITALS — BP 97/69 | HR 71 | Temp 98.7°F | Resp 19 | Ht 60.35 in

## 2023-07-22 DIAGNOSIS — I82451 Acute embolism and thrombosis of right peroneal vein: Secondary | ICD-10-CM | POA: Insufficient documentation

## 2023-07-22 DIAGNOSIS — Z7901 Long term (current) use of anticoagulants: Secondary | ICD-10-CM | POA: Diagnosis not present

## 2023-07-22 DIAGNOSIS — Z800 Family history of malignant neoplasm of digestive organs: Secondary | ICD-10-CM | POA: Insufficient documentation

## 2023-07-22 DIAGNOSIS — I82491 Acute embolism and thrombosis of other specified deep vein of right lower extremity: Secondary | ICD-10-CM | POA: Insufficient documentation

## 2023-07-22 DIAGNOSIS — I82461 Acute embolism and thrombosis of right calf muscular vein: Secondary | ICD-10-CM | POA: Insufficient documentation

## 2023-07-22 DIAGNOSIS — I824Z1 Acute embolism and thrombosis of unspecified deep veins of right distal lower extremity: Secondary | ICD-10-CM

## 2023-07-22 LAB — ANTITHROMBIN III: AntiThromb III Func: 117 % (ref 75–120)

## 2023-07-22 MED ORDER — APIXABAN 5 MG PO TABS
5.0000 mg | ORAL_TABLET | Freq: Two times a day (BID) | ORAL | 1 refills | Status: DC
Start: 2023-07-22 — End: 2023-07-29

## 2023-07-22 NOTE — Assessment & Plan Note (Addendum)
#    July 09, 2023-[incidental-elevated D-dimer]- acute DVT of the right proximal to mid posterior tibial and peroneal veins in the tibial peroneal trunk.  [name] -symptomatically improved.  # Recommend anticoagulation for total of 3 months. Will refill for 2 more months [total of 3 months].  Will plan repeating ultrasound the bilateral extremities-towards the end of the anticoagulation.  # Etiology: Is unclear-no obvious provoking factors.  Recommend further workup for hypercoagulable state.  Up-to-date on cancer screening.  Thank you Dr. Nishan for allowing me to participate in the care of your pleasant patient. Please do not hesitate to contact me with questions or concerns in the interim.  # DISPOSITION: # labs- today- ordered # follow up in 2-3 weeks- MD; no labs- Dr.B

## 2023-07-22 NOTE — Progress Notes (Signed)
 [facility] CONSULT NOTE  Patient Care Team: Trudy Dorn BRAVO, [name], [name], MD as Consulting Physician (Oncology)  CHIEF COMPLAINTS/PURPOSE OF CONSULTATION: DVT/PE  #  Oncology History   No history exists.     HISTORY OF PRESENTING ILLNESS: Patient ambulating-independently- Accompanied by family.   Maureen Velazquez 48 y.o.  female with no prior history of thrombo-embolism has been referred to us  regarding recent DVT.   Patient states she was recently evaluated in the emergency room for body aches nausea dizziness heaviness in the chest.  Subjective fevers positive for chills.  She underwent a CT scan-PE protocol negative.  Of note patient was treated with doxycycline  for possible tickborne illness.  However incidentally patient was noted to have an elevated D-dimer-which led to further evaluation with ultrasound of the legs-that showed right lower extremity DVT.  Patient currently [name] .  Patient had minimal cramping of the right lower extremity-which is currently resolved [name] .  Patient symptoms are significantly improved-however not back to baseline continues to have fatigue and also ringing in the ears.  Patient also complains of ongoing joint pains for which she has been worked up for autoimmune disorders however serology negative as per patient.  With regards risk factors: Long distance travel- > 8 hours: none Recent surgery GA; Immobilization/trauma: none Previous history of DVT/PE: none Smoking: none Family history: mom- strokes/CAD [in 70s]  Cancer screening: colo-  in last 3 years; mammo- WNL Birth control pills: none  Miscarriages: hx of 1 - < 3 months.   Findings consistent with acute DVT of the right proximal to mid posterior tibial and peroneal veins in the tibial peroneal trunk July 09, 2023  Review of Systems  Constitutional:   Negative for chills, diaphoresis, fever, malaise/fatigue and weight loss.  HENT:  Negative for nosebleeds and sore throat.   Eyes:  Negative for double vision.  Respiratory:  Negative for cough, hemoptysis, sputum production, shortness of breath and wheezing.   Cardiovascular:  Negative for chest pain, palpitations, orthopnea and leg swelling.  Gastrointestinal:  Negative for abdominal pain, blood in stool, constipation, diarrhea, heartburn, melena, nausea and vomiting.  Genitourinary:  Negative for dysuria, frequency and urgency.  Musculoskeletal:  Negative for back pain and joint pain.  Skin: Negative.  Negative for itching and rash.  Neurological:  Negative for dizziness, tingling, focal weakness, weakness and headaches.  Endo/Heme/Allergies:  Does not bruise/bleed easily.  Psychiatric/Behavioral:  Negative for depression. The patient is not nervous/anxious and does not have insomnia.      MEDICAL HISTORY:  Past Medical History:  Diagnosis Date   ADHD (attention deficit hyperactivity disorder), inattentive type    Allergic rhinitis due to pollen    Allergy    Asthma    Endometriosis    Skin cancer     SURGICAL HISTORY: Past Surgical History:  Procedure Laterality Date   LAPAROSCOPIC OVARIAN CYSTECTOMY     MELANOMA EXCISION  07/2017   in situ---posterior neck   OVARIAN CYST REMOVAL Right ~2007    SOCIAL HISTORY: Social History   Socioeconomic History   Marital status: Married    Spouse name: Tod   Number of children: 4   Years of education: Not on file   Highest education level: Not on file  Occupational History   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  Tobacco Use   Smoking status: Never  Passive exposure: Past   Smokeless tobacco: Never  Vaping Use   Vaping status: Never Used  Substance and Sexual Activity   Alcohol use: Yes    Comment: occ wine   Drug use: No   Sexual activity: Yes  Other Topics Concern   Not on file   Social History Narrative   Not on file   Social Drivers of Health   Financial Resource Strain: Low Risk  (02/22/2023)   Received from [facility]   Overall Financial Resource Strain (CARDIA)    Difficulty of Paying Living Expenses: Not very hard  Food Insecurity: No Food Insecurity (07/22/2023)   Hunger Vital Sign    Worried About Running Out of Food in the Last Year: Never true    Ran Out of Food in the Last Year: Never true  Transportation Needs: No Transportation Needs (07/22/2023)   PRAPARE - Administrator, Civil Service (Medical): No    Lack of Transportation (Non-Medical): No  Physical Activity: Not on file  Stress: Not on file  Social Connections: Not on file  Intimate Partner Violence: Not At Risk (07/22/2023)   Humiliation, Afraid, Rape, and Kick questionnaire    Fear of Current or Ex-Partner: No    Emotionally Abused: No    Physically Abused: No    Sexually Abused: No    FAMILY HISTORY: Family History  Problem Relation Age of Onset   Hypertension Mother    Heart disease Mother        coronary stents   Hyperlipidemia Mother    Hypertension Father    Hyperlipidemia Father    Melanoma Father    Hypertension Sister    Cancer Maternal Grandmother        colon    Cancer Maternal Grandfather        colon   Hyperlipidemia Paternal Grandmother    Heart disease Paternal Grandfather    Stroke Paternal Grandfather    Parkinson's disease Paternal Grandfather    Diabetes Neg Hx     ALLERGIES:  has no known allergies.  MEDICATIONS:  Current Outpatient Medications  Medication Sig Dispense Refill   apixaban  (ELIQUIS ) 5 MG TABS tablet Take 1 tablet (5 mg total) by mouth 2 (two) times daily. 60 tablet 1   APIXABAN  (ELIQUIS ) VTE STARTER PACK (10MG  AND 5MG ) Take as directed on package: start with two-5mg  tablets twice daily for 7 days. On day 8, switch to one-5mg  tablet twice daily. 74 each 0   doxycycline  (VIBRAMYCIN ) 100 MG capsule Take 1  capsule (100 mg total) by mouth 2 (two) times daily. (Patient not taking: Reported on 07/22/2023) 20 capsule 0   MAGNESIUM GLYCINATE PO Take by mouth. (Patient not taking: Reported on 07/22/2023)     Nutritional Supplements (ESTROVEN PO) Take by mouth.     No current facility-administered medications for this visit.       PHYSICAL EXAMINATION:  Vitals:   07/22/23 1108  BP: 97/69  Pulse: 71  Resp: 19  Temp: 98.7 F (37.1 C)  SpO2: 100%   There were no vitals filed for this visit.  Physical Exam Vitals and nursing note reviewed.  HENT:     Head: Normocephalic and atraumatic.     Mouth/Throat:     Pharynx: Oropharynx is clear.  Eyes:     Extraocular Movements: Extraocular movements intact.     Pupils: Pupils are equal, round, and reactive to light.  Cardiovascular:     Rate and Rhythm: Normal rate and  regular rhythm.  Pulmonary:     Comments: Decreased breath sounds bilaterally.  Abdominal:     Palpations: Abdomen is soft.  Musculoskeletal:        General: Normal range of motion.     Cervical back: Normal range of motion.  Skin:    General: Skin is warm.  Neurological:     General: No focal deficit present.     Mental Status: She is alert and oriented to person, place, and time.  Psychiatric:        Behavior: Behavior normal.        Judgment: Judgment normal.      LABORATORY DATA:  I have reviewed the data as listed Lab Results  Component Value Date   WBC 4.7 07/12/2023   HGB 13.9 07/12/2023   HCT 41.0 07/12/2023   MCV 84.6 07/12/2023   PLT 231 07/12/2023   Recent Labs    07/09/23 0939 07/12/23 1559 07/12/23 1620  NA 138 139 139  K 3.4* 4.0 4.3  CL 102 102 100  CO2 27 26  --   GLUCOSE 107* 95 92  BUN 18 21* 33*  CREATININE 0.86 0.83 0.90  CALCIUM 9.1 10.0  --   GFRNONAA >60 >60  --   PROT  --  7.5  --   ALBUMIN  --  4.2  --   AST  --  35  --   ALT  --  15  --   ALKPHOS  --  60  --   BILITOT  --  0.3  --     RADIOGRAPHIC STUDIES: I have  personally reviewed the radiological images as listed and agreed with the findings in the report. CT Angio Chest PE W and/or Wo Contrast Result Date: 07/12/2023 CLINICAL DATA:  Chest pain and tightness for the past 3 days. Right lower leg DVT treated with Eliquis . EXAM: CT ANGIOGRAPHY CHEST WITH CONTRAST TECHNIQUE: Multidetector CT imaging of the chest was performed using the standard protocol during bolus administration of intravenous contrast. Multiplanar CT image reconstructions and MIPs were obtained to evaluate the vascular anatomy. RADIATION DOSE REDUCTION: This exam was performed according to the departmental dose-optimization program which includes automated exposure control, adjustment of the mA and/or kV according to patient size and/or use of iterative reconstruction technique. CONTRAST:  75mL OMNIPAQUE  IOHEXOL  350 MG/ML SOLN COMPARISON:  07/09/2023. FINDINGS: Cardiovascular: Satisfactory opacification of the pulmonary arteries to the segmental level. No evidence of pulmonary embolism. Normal heart size. No pericardial effusion. Mediastinum/Nodes: No enlarged mediastinal, hilar, or axillary lymph nodes. Thyroid gland, trachea, and esophagus demonstrate no significant findings. Lungs/Pleura: Lungs are clear. No pleural effusion or pneumothorax. Upper Abdomen: Unremarkable. Musculoskeletal: Minimal thoracic spine degenerative changes. Review of the MIP images confirms the above findings. IMPRESSION: No evidence of pulmonary embolism or other acute abnormality. Electronically Signed   By: Elspeth Bathe M.D.   On: 07/12/2023 17:55   VAS US  LOWER EXTREMITY VENOUS (DVT) (7a-7p) Result Date: 07/10/2023  Lower Venous DVT Study Patient Name:  Maureen Velazquez  Date of Exam:   07/09/2023 Medical Rec #: [id number]       Accession #:    [id number] Date of Birth: Dec 27, 1975        Patient Gender: F Patient Age:   38 years Exam Location:  [facility] Procedure:      VAS US  LOWER EXTREMITY VENOUS (DVT) Referring  Phys: LYNWOOD TEMPLETON --------------------------------------------------------------------------------  Indications: Pain, Swelling, SOB, and Elevated D-Dimer. Negative for PE by CTA  Comparison Study: No prior study on file Performing Technologist: Alberta Lis RVS  Examination Guidelines: A complete evaluation includes B-mode imaging, spectral Doppler, color Doppler, and power Doppler as needed of all accessible portions of each vessel. Bilateral testing is considered an integral part of a complete examination. Limited examinations for reoccurring indications may be performed as noted. The reflux portion of the exam is performed with the patient in reverse Trendelenburg.  +---------+---------------+---------+-----------+----------+--------------+ RIGHT    CompressibilityPhasicitySpontaneityPropertiesThrombus Aging +---------+---------------+---------+-----------+----------+--------------+ CFV      Full           Yes      Yes                                 +---------+---------------+---------+-----------+----------+--------------+ SFJ      Full                                                        +---------+---------------+---------+-----------+----------+--------------+ FV Prox  Full                                                        +---------+---------------+---------+-----------+----------+--------------+ FV Mid   Full                                                        +---------+---------------+---------+-----------+----------+--------------+ FV DistalFull                                                        +---------+---------------+---------+-----------+----------+--------------+ PFV      Full                                                        +---------+---------------+---------+-----------+----------+--------------+ POP      Full           Yes      Yes                                  +---------+---------------+---------+-----------+----------+--------------+ PTV      None                                         Acute          +---------+---------------+---------+-----------+----------+--------------+ PERO     None  Acute          +---------+---------------+---------+-----------+----------+--------------+ Gastroc  Full                                                        +---------+---------------+---------+-----------+----------+--------------+ TP trunk                                              Acute          +---------+---------------+---------+-----------+----------+--------------+   +----+---------------+---------+-----------+----------+--------------+ LEFTCompressibilityPhasicitySpontaneityPropertiesThrombus Aging +----+---------------+---------+-----------+----------+--------------+ CFV Full           Yes      Yes                                 +----+---------------+---------+-----------+----------+--------------+    Summary: RIGHT: - Findings consistent with acute deep vein thrombosis involving the right proximal to mid posterior tibial and peroneal veins, and at the Tibioperoneal trunk.  - No cystic structure found in the popliteal fossa.  LEFT: - No evidence of common femoral vein obstruction.   *See table(s) above for measurements and observations. Electronically signed by Norman Serve on 07/10/2023 at 8:51:43 AM.    Final    CT Angio Chest PE W and/or Wo Contrast Result Date: 07/09/2023 CLINICAL DATA:  Positive D-dimer. Low to intermediate probability for pulmonary embolism. Please evaluate. EXAM: CT ANGIOGRAPHY CHEST WITH CONTRAST TECHNIQUE: Multidetector CT imaging of the chest was performed using the standard protocol during bolus administration of intravenous contrast. Multiplanar CT image reconstructions and MIPs were obtained to evaluate the vascular anatomy. RADIATION DOSE REDUCTION: This  exam was performed according to the departmental dose-optimization program which includes automated exposure control, adjustment of the mA and/or kV according to patient size and/or use of iterative reconstruction technique. CONTRAST:  50mL OMNIPAQUE  IOHEXOL  350 MG/ML SOLN COMPARISON:  CTA chest 09/21/2019 FINDINGS: Cardiovascular: Heart is normal size. Thoracic aorta is normal caliber. Normal 3 vessel takeoff from the aortic arch. Pulmonary arterial system is well opacified without evidence of emboli. Mediastinum/Nodes: Mediastinal structures are unremarkable. No evidence of adenopathy match that no evidence of mediastinal or hilar adenopathy. Lungs/Pleura: Lungs are adequately inflated without acute airspace consolidation or effusion. Interval resolution of the previously seen subpleural nodular foci over the lower lobes. Airways are normal. Upper Abdomen: No acute findings. Musculoskeletal: Normal. Review of the MIP images confirms the above findings. IMPRESSION: 1. No acute cardiopulmonary disease. No evidence of pulmonary emboli. 2. Interval resolution of the previously seen subpleural nodular foci over the lower lobes. Electronically Signed   By: Toribio Agreste M.D.   On: 07/09/2023 14:29   DG Chest 2 View Result Date: 07/09/2023 CLINICAL DATA:  Chest pain. EXAM: CHEST - 2 VIEW COMPARISON:  08/29/2019 FINDINGS: The heart size and mediastinal contours are within normal limits. Both lungs are clear. Nipple shadows incidentally noted overlying lung bases. The visualized skeletal structures are unremarkable. IMPRESSION: No active cardiopulmonary disease. Electronically Signed   By: Norleen DELENA Kil M.D.   On: 07/09/2023 10:19    ASSESSMENT & PLAN:   Acute deep vein thrombosis (DVT) of calf muscle vein of right lower extremity [facility]) #  July 09, 2023-[incidental-elevated D-dimer]- acute DVT  of the right proximal to mid posterior tibial and peroneal veins in the tibial peroneal trunk.  [name] -symptomatically  improved.  # Recommend anticoagulation for total of 3 months. Will refill for 2 more months [total of 3 months].  Will plan repeating ultrasound the bilateral extremities-towards the end of the anticoagulation.  # Etiology: Is unclear-no obvious provoking factors.  Recommend further workup for hypercoagulable state.  Up-to-date on cancer screening.  Thank you Dr. Nishan for allowing me to participate in the care of your pleasant patient. Please do not hesitate to contact me with questions or concerns in the interim.  # DISPOSITION: # labs- today- ordered # follow up in 2-3 weeks- MD; no labs- Dr.B     Cindy JONELLE Joe, MD 07/22/2023 1:42 PM

## 2023-07-22 NOTE — Progress Notes (Signed)
 Patient presents today as a new patient, she states she has no concerns at this time.

## 2023-07-23 LAB — ANTIPHOSPHOLIPID SYNDROME PROF
Anticardiolipin IgG: <9 GPL U/mL (ref 0–14)
Anticardiolipin IgM: 10 [MPL'U]/mL (ref 0–12)
DRVVT: 51.1 s — ABNORMAL HIGH (ref 0.0–47.0)
PTT Lupus Anticoagulant: 30.2 s (ref 0.0–43.5)

## 2023-07-23 LAB — BETA-2-GLYCOPROTEIN I ABS, IGG/M/A
Beta-2 Glyco I IgG: <9 GPI IgG units (ref 0–20)
Beta-2-Glycoprotein I IgA: <9 GPI IgA units (ref 0–25)
Beta-2-Glycoprotein I IgM: 11 GPI IgM units (ref 0–32)

## 2023-07-23 LAB — DRVVT MIX: dRVVT Mix: 44.5 s — ABNORMAL HIGH (ref 0.0–40.4)

## 2023-07-23 LAB — DRVVT CONFIRM: dRVVT Confirm: 0.9 ratio (ref 0.8–1.2)

## 2023-07-23 LAB — PROTEIN S ACTIVITY: Protein S Activity: 79 % (ref 63–140)

## 2023-07-23 LAB — PROTEIN C ACTIVITY: Protein C Activity: 124 % (ref 73–180)

## 2023-07-27 LAB — PROTHROMBIN GENE MUTATION

## 2023-07-27 LAB — FACTOR 5 LEIDEN

## 2023-07-29 ENCOUNTER — Telehealth: Payer: Self-pay | Admitting: *Deleted

## 2023-07-29 MED ORDER — APIXABAN 5 MG PO TABS: 5.0000 mg | ORAL_TABLET | Freq: Two times a day (BID) | ORAL | 1 refills | Status: DC

## 2023-07-29 NOTE — Telephone Encounter (Signed)
 Patient called and said that she went over to [facility] and the Eliquis  prescription was not there.  I looked into it and it sounded like that you put it in but it came to print and it never printed.  So I am refilling it and sending it to you Dr. Rennie to approve.  I called the patient back got her voicemail and told her that it went print and that is why it did not go to [facility] and so we have sent it in again and I am waiting for Dr. Rennie to hit the button so it goes to [facility] for you today

## 2023-08-05 ENCOUNTER — Encounter (HOSPITAL_COMMUNITY): Payer: Self-pay | Admitting: *Deleted

## 2023-08-05 ENCOUNTER — Telehealth (HOSPITAL_COMMUNITY): Payer: Self-pay | Admitting: *Deleted

## 2023-08-05 NOTE — Telephone Encounter (Signed)
 Instructions for ETT sent via [employer].

## 2023-08-08 ENCOUNTER — Telehealth: Payer: Self-pay | Admitting: Podiatry

## 2023-08-08 NOTE — Telephone Encounter (Signed)
 DOS- 08/11/2023  KELLER BUNION IMPLANT RT- 71708  [employer] EFFECTIVE DATE- 01/12/2023  DEDUCTIBLE- $500 REMAINING- $248.38S FAMILY DEDUCTIBLE- $1000 REMAINING- $248.38 OOP- $3000 REMAINING- $2078.82 FAMILY OOP- $6000 REMAINING $3958.45 COINSURANCE- 20% AFTER DEDU  PER [employer] WEBSITE, PRIOR AUTH HAS BEEN APPROVED FOR CPT CODE 71708 FROM 08/11/2023-11/09/2023. REF ID- [id number]

## 2023-08-12 ENCOUNTER — Other Ambulatory Visit: Payer: Self-pay

## 2023-08-12 ENCOUNTER — Inpatient Hospital Stay: Attending: Internal Medicine | Admitting: Internal Medicine

## 2023-08-12 ENCOUNTER — Inpatient Hospital Stay

## 2023-08-12 ENCOUNTER — Encounter: Payer: Self-pay | Admitting: Internal Medicine

## 2023-08-12 DIAGNOSIS — I82461 Acute embolism and thrombosis of right calf muscular vein: Secondary | ICD-10-CM

## 2023-08-12 DIAGNOSIS — Z7901 Long term (current) use of anticoagulants: Secondary | ICD-10-CM | POA: Diagnosis not present

## 2023-08-12 DIAGNOSIS — D649 Anemia, unspecified: Secondary | ICD-10-CM | POA: Diagnosis present

## 2023-08-12 DIAGNOSIS — Z86718 Personal history of other venous thrombosis and embolism: Secondary | ICD-10-CM | POA: Diagnosis present

## 2023-08-12 LAB — CBC WITH DIFFERENTIAL/PLATELET
Abs Immature Granulocytes: 0.02 K/uL (ref 0.00–0.07)
Basophils Absolute: 0 K/uL (ref 0.0–0.1)
Basophils Relative: 0 %
Eosinophils Absolute: 0.1 K/uL (ref 0.0–0.5)
Eosinophils Relative: 1 %
HCT: 38.1 % (ref 36.0–46.0)
Hemoglobin: 12.7 g/dL (ref 12.0–15.0)
Immature Granulocytes: 0 %
Lymphocytes Relative: 28 %
Lymphs Abs: 1.9 K/uL (ref 0.7–4.0)
MCH: 28.8 pg (ref 26.0–34.0)
MCHC: 33.3 g/dL (ref 30.0–36.0)
MCV: 86.4 fL (ref 80.0–100.0)
Monocytes Absolute: 0.7 K/uL (ref 0.1–1.0)
Monocytes Relative: 10 %
Neutro Abs: 4.1 K/uL (ref 1.7–7.7)
Neutrophils Relative %: 61 %
Platelets: 260 K/uL (ref 150–400)
RBC: 4.41 MIL/uL (ref 3.87–5.11)
RDW: 13.3 % (ref 11.5–15.5)
WBC: 6.9 K/uL (ref 4.0–10.5)
nRBC: 0 % (ref 0.0–0.2)

## 2023-08-12 LAB — FERRITIN: Ferritin: 8 ng/mL — ABNORMAL LOW (ref 11–307)

## 2023-08-12 LAB — IRON AND TIBC
Iron: 56 ug/dL (ref 28–170)
Saturation Ratios: 13 % (ref 10.4–31.8)
TIBC: 420 ug/dL (ref 250–450)
UIBC: 364 ug/dL

## 2023-08-12 NOTE — Progress Notes (Signed)
 [facility] CONSULT NOTE  Patient Care Team: Trudy Dorn BRAVO, [name], [name], MD as Consulting Physician (Oncology)  CHIEF COMPLAINTS/PURPOSE OF CONSULTATION: DVT/PE  #  Oncology History   No history exists.     HISTORY OF PRESENTING ILLNESS: Patient ambulating-independently- Accompanied by family.   Maureen Velazquez 48 y.o.  female with right lower extremity DVT currently [name]  is here for follow-up/and review results of the blood work.  Pt has some fatigue. Has noticed a pulsating feeling in her right calf. No swelling or pain. Appetite is normal. Denies dyspnea   She is compliant with Eliquis .   Review of Systems  Constitutional:  Negative for chills, diaphoresis, fever, malaise/fatigue and weight loss.  HENT:  Negative for nosebleeds and sore throat.   Eyes:  Negative for double vision.  Respiratory:  Negative for cough, hemoptysis, sputum production, shortness of breath and wheezing.   Cardiovascular:  Negative for chest pain, palpitations, orthopnea and leg swelling.  Gastrointestinal:  Negative for abdominal pain, blood in stool, constipation, diarrhea, heartburn, melena, nausea and vomiting.  Genitourinary:  Negative for dysuria, frequency and urgency.  Musculoskeletal:  Negative for back pain and joint pain.  Skin: Negative.  Negative for itching and rash.  Neurological:  Negative for dizziness, tingling, focal weakness, weakness and headaches.  Endo/Heme/Allergies:  Does not bruise/bleed easily.  Psychiatric/Behavioral:  Negative for depression. The patient is not nervous/anxious and does not have insomnia.      MEDICAL HISTORY:  Past Medical History:  Diagnosis Date   ADHD (attention deficit hyperactivity disorder), inattentive type    Allergic rhinitis due to pollen    Allergy    Asthma    Endometriosis    Skin cancer     SURGICAL  HISTORY: Past Surgical History:  Procedure Laterality Date   LAPAROSCOPIC OVARIAN CYSTECTOMY     MELANOMA EXCISION  07/2017   in situ---posterior neck   OVARIAN CYST REMOVAL Right ~2007    SOCIAL HISTORY: Social History   Socioeconomic History   Marital status: Married    Spouse name: Tod   Number of children: 4   Years of education: Not on file   Highest education level: Not on file  Occupational History   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   Overall Financial Resource Strain (CARDIA)    Difficulty of Paying Living Expenses: Not very hard  Food Insecurity: No Food Insecurity (07/22/2023)   Hunger Vital Sign    Worried About Running Out of Food in the Last Year: Never true    Ran Out of Food in the Last Year: Never true  Transportation Needs: No Transportation Needs (07/22/2023)   PRAPARE - Administrator, Civil Service (Medical): No    Lack of Transportation (Non-Medical): No  Physical Activity: Not on file  Stress: Not  on file  Social Connections: Not on file  Intimate Partner Violence: Not At Risk (07/22/2023)   Humiliation, Afraid, Rape, and Kick questionnaire    Fear of Current or Ex-Partner: No    Emotionally Abused: No    Physically Abused: No    Sexually Abused: No    FAMILY HISTORY: Family History  Problem Relation Age of Onset   Hypertension Mother    Heart disease Mother        coronary stents   Hyperlipidemia Mother    Hypertension Father    Hyperlipidemia Father     Melanoma Father    Hypertension Sister    Cancer Maternal Grandmother        colon    Cancer Maternal Grandfather        colon   Hyperlipidemia Paternal Grandmother    Heart disease Paternal Grandfather    Stroke Paternal Grandfather    Parkinson's disease Paternal Grandfather    Diabetes Neg Hx     ALLERGIES:  has no known allergies.  MEDICATIONS:  Current Outpatient Medications  Medication Sig Dispense Refill   apixaban  (ELIQUIS ) 5 MG TABS tablet Take 1 tablet (5 mg total) by mouth 2 (two) times daily. 60 tablet 1   MAGNESIUM GLYCINATE PO Take by mouth. (Patient not taking: Reported on 08/12/2023)     No current facility-administered medications for this visit.       PHYSICAL EXAMINATION:  Vitals:   08/12/23 1352  BP: 102/72  Pulse: 72  Resp: 16  Temp: 98.3 F (36.8 C)  SpO2: 100%    Filed Weights   08/12/23 1352  Weight: 119 lb 4.8 oz (54.1 kg)    Physical Exam Vitals and nursing note reviewed.  HENT:     Head: Normocephalic and atraumatic.     Mouth/Throat:     Pharynx: Oropharynx is clear.  Eyes:     Extraocular Movements: Extraocular movements intact.     Pupils: Pupils are equal, round, and reactive to light.  Cardiovascular:     Rate and Rhythm: Normal rate and regular rhythm.  Abdominal:     Palpations: Abdomen is soft.  Musculoskeletal:        General: Normal range of motion.     Cervical back: Normal range of motion.  Skin:    General: Skin is warm.  Neurological:     General: No focal deficit present.     Mental Status: She is alert and oriented to person, place, and time.  Psychiatric:        Behavior: Behavior normal.        Judgment: Judgment normal.      LABORATORY DATA:  I have reviewed the data as listed Lab Results  Component Value Date   WBC 6.9 08/12/2023   HGB 12.7 08/12/2023   HCT 38.1 08/12/2023   MCV 86.4 08/12/2023   PLT 260 08/12/2023   Recent Labs    07/09/23 0939 07/12/23 1559 07/12/23 1620  NA 138  139 139  K 3.4* 4.0 4.3  CL 102 102 100  CO2 27 26  --   GLUCOSE 107* 95 92  BUN 18 21* 33*  CREATININE 0.86 0.83 0.90  CALCIUM 9.1 10.0  --   GFRNONAA >60 >60  --   PROT  --  7.5  --   ALBUMIN  --  4.2  --   AST  --  35  --   ALT  --  15  --   ALKPHOS  --  60  --   BILITOT  --  0.3  --     RADIOGRAPHIC STUDIES: I have personally reviewed the radiological images as listed and agreed with the findings in the report. No results found.   ASSESSMENT & PLAN:   Acute deep vein thrombosis (DVT) of calf muscle vein of right lower extremity (HCC) #  July 09, 2023-[incidental-elevated D-dimer]- acute DVT of the right proximal to mid posterior tibial and peroneal veins in the tibial peroneal trunk.  [name] -symptomatically improved. Hypercoagublae work up- NEGATIVE. Etiology: Is unclear-no obvious provoking factors.  # Recommend anticoagulation for total of 3 months [] finish end of sep, 2025].  Will plan repeating ultrasound the bilateral extremities-towards the end of the anticoagulation- ordered today.   # Recent slight drop in hemoglobin [likely GI, Dr.Mann] recommend repeating labs iron studies.-    # Plan for toe surgery elective-hold off but on anticoagulation will consider down the line.  # DISPOSITION: # lab today- cbc/iron studies; ferritin- ordered-  # US  in 1st week of oct # follow up- MD 2nd week of oct-  no labs- Dr.B      Cindy JONELLE Joe, MD 08/12/2023 3:02 PM

## 2023-08-12 NOTE — Progress Notes (Signed)
 Pt has some fatigue. Has noticed a pulsating feeling in her right calf. No swelling or pain. Appetite is normal. Denies dyspnea.

## 2023-08-12 NOTE — Assessment & Plan Note (Addendum)
#    July 09, 2023-[incidental-elevated D-dimer]- acute DVT of the right proximal to mid posterior tibial and peroneal veins in the tibial peroneal trunk.  [name] -symptomatically improved. Hypercoagublae work up- NEGATIVE. Etiology: Is unclear-no obvious provoking factors.  # Recommend anticoagulation for total of 3 months [] finish end of sep, 2025].  Will plan repeating ultrasound the bilateral extremities-towards the end of the anticoagulation- ordered today.   # Recent slight drop in hemoglobin [likely GI, Dr.Mann] recommend repeating labs iron studies.-    # Plan for toe surgery elective-hold off but on anticoagulation will consider down the line.  # DISPOSITION: # lab today- cbc/iron studies; ferritin- ordered-  # US  in 1st week of oct # follow up- MD 2nd week of oct-  no labs- Dr.B

## 2023-08-17 ENCOUNTER — Encounter: Admitting: Podiatry

## 2023-08-18 ENCOUNTER — Ambulatory Visit (HOSPITAL_COMMUNITY)
Admission: RE | Admit: 2023-08-18 | Discharge: 2023-08-18 | Disposition: A | Discharge status: Home / Self Care | Source: Ambulatory Visit | Attending: Cardiology | Admitting: Cardiology

## 2023-08-18 DIAGNOSIS — I82451 Acute embolism and thrombosis of right peroneal vein: Secondary | ICD-10-CM | POA: Insufficient documentation

## 2023-08-18 DIAGNOSIS — R079 Chest pain, unspecified: Secondary | ICD-10-CM | POA: Insufficient documentation

## 2023-08-18 LAB — EXERCISE TOLERANCE TEST
Angina Index: 0
Duke Treadmill Score: 14
Estimated workload: 16.8
Exercise duration (min): 14 min
Exercise duration (sec): 0 s
MPHR: 171 {beats}/min
Peak HR: 181 {beats}/min
Percent HR: 105 %
Rest HR: 71 {beats}/min
ST Depression (mm): 0.5 mm

## 2023-08-24 ENCOUNTER — Encounter: Admitting: Podiatry

## 2023-08-25 ENCOUNTER — Other Ambulatory Visit: Payer: Self-pay

## 2023-08-25 ENCOUNTER — Ambulatory Visit: Payer: Self-pay | Admitting: Internal Medicine

## 2023-08-25 DIAGNOSIS — I82461 Acute embolism and thrombosis of right calf muscular vein: Secondary | ICD-10-CM

## 2023-08-26 ENCOUNTER — Ambulatory Visit (HOSPITAL_COMMUNITY)
Admission: RE | Admit: 2023-08-26 | Discharge: 2023-08-26 | Disposition: A | Discharge status: Home / Self Care | Source: Ambulatory Visit | Attending: Cardiovascular Disease | Admitting: Cardiovascular Disease

## 2023-08-26 DIAGNOSIS — R079 Chest pain, unspecified: Secondary | ICD-10-CM | POA: Diagnosis not present

## 2023-08-26 DIAGNOSIS — I82451 Acute embolism and thrombosis of right peroneal vein: Secondary | ICD-10-CM

## 2023-08-26 LAB — ECHOCARDIOGRAM COMPLETE
Area-P 1/2: 3.4 cm2
S' Lateral: 2.86 cm

## 2023-09-07 ENCOUNTER — Encounter: Admitting: Podiatry

## 2023-09-09 ENCOUNTER — Ambulatory Visit: Admitting: Cardiovascular Disease

## 2023-09-13 ENCOUNTER — Telehealth: Payer: Self-pay | Admitting: *Deleted

## 2023-09-13 NOTE — Telephone Encounter (Signed)
 Patient called in stating that she had an old clot on the right leg and she had gotten Eliquis  that she takes and over the weekend she states that she has her cath with a sensation of pulsing and cramping.  She has never had that ever and she wanted to know Dr. Rennie  thinks about this issue .  She would like to see what Dr. Rennie thinks about and if she needs to go to someone else to let her know

## 2023-09-14 ENCOUNTER — Ambulatory Visit
Admission: RE | Admit: 2023-09-14 | Discharge: 2023-09-14 | Disposition: A | Discharge status: Home / Self Care | Source: Ambulatory Visit | Attending: Internal Medicine | Admitting: Internal Medicine

## 2023-09-14 ENCOUNTER — Other Ambulatory Visit: Payer: Self-pay | Admitting: Internal Medicine

## 2023-09-14 ENCOUNTER — Telehealth: Payer: Self-pay | Admitting: Internal Medicine

## 2023-09-14 DIAGNOSIS — I82461 Acute embolism and thrombosis of right calf muscular vein: Secondary | ICD-10-CM

## 2023-09-14 DIAGNOSIS — R252 Cramp and spasm: Secondary | ICD-10-CM | POA: Insufficient documentation

## 2023-09-14 NOTE — Telephone Encounter (Signed)
 Spoke to patient-cramping in the right leg-history of right lower extremity DVT.  [name] .  Order Doppler-lower extremity-right-ASAP.  Cancel the ultrasound ordered for October.   GB

## 2023-09-15 ENCOUNTER — Ambulatory Visit

## 2023-09-15 ENCOUNTER — Ambulatory Visit: Payer: Self-pay | Admitting: Internal Medicine

## 2023-09-19 ENCOUNTER — Telehealth: Payer: Self-pay | Admitting: Internal Medicine

## 2023-09-19 NOTE — Telephone Encounter (Signed)
 Error

## 2023-09-20 ENCOUNTER — Other Ambulatory Visit: Payer: Self-pay

## 2023-09-20 DIAGNOSIS — I82461 Acute embolism and thrombosis of right calf muscular vein: Secondary | ICD-10-CM

## 2023-09-20 MED ORDER — APIXABAN 5 MG PO TABS: 5.0000 mg | ORAL_TABLET | Freq: Two times a day (BID) | ORAL | 1 refills | Status: DC

## 2023-10-14 ENCOUNTER — Ambulatory Visit

## 2023-10-28 ENCOUNTER — Other Ambulatory Visit

## 2023-10-28 ENCOUNTER — Ambulatory Visit: Admitting: Nurse Practitioner

## 2023-10-28 ENCOUNTER — Inpatient Hospital Stay: Admitting: Nurse Practitioner

## 2023-10-28 ENCOUNTER — Ambulatory Visit: Admitting: Internal Medicine

## 2023-10-28 ENCOUNTER — Inpatient Hospital Stay: Attending: Internal Medicine

## 2023-10-28 ENCOUNTER — Encounter: Payer: Self-pay | Admitting: Nurse Practitioner

## 2023-10-28 VITALS — BP 99/75 | HR 63 | Temp 96.9°F | Resp 16 | Wt 120.0 lb

## 2023-10-28 DIAGNOSIS — Z7901 Long term (current) use of anticoagulants: Secondary | ICD-10-CM | POA: Diagnosis not present

## 2023-10-28 DIAGNOSIS — Z7189 Other specified counseling: Secondary | ICD-10-CM

## 2023-10-28 DIAGNOSIS — Z860100 Personal history of colon polyps, unspecified: Secondary | ICD-10-CM | POA: Insufficient documentation

## 2023-10-28 DIAGNOSIS — K219 Gastro-esophageal reflux disease without esophagitis: Secondary | ICD-10-CM | POA: Insufficient documentation

## 2023-10-28 DIAGNOSIS — D649 Anemia, unspecified: Secondary | ICD-10-CM | POA: Insufficient documentation

## 2023-10-28 DIAGNOSIS — K600 Acute anal fissure: Secondary | ICD-10-CM | POA: Insufficient documentation

## 2023-10-28 DIAGNOSIS — Z86718 Personal history of other venous thrombosis and embolism: Secondary | ICD-10-CM | POA: Insufficient documentation

## 2023-10-28 DIAGNOSIS — I82461 Acute embolism and thrombosis of right calf muscular vein: Secondary | ICD-10-CM

## 2023-10-28 DIAGNOSIS — Z800 Family history of malignant neoplasm of digestive organs: Secondary | ICD-10-CM | POA: Insufficient documentation

## 2023-10-28 DIAGNOSIS — Z83719 Family history of colon polyps, unspecified: Secondary | ICD-10-CM | POA: Insufficient documentation

## 2023-10-28 DIAGNOSIS — K921 Melena: Secondary | ICD-10-CM | POA: Insufficient documentation

## 2023-10-28 DIAGNOSIS — K625 Hemorrhage of anus and rectum: Secondary | ICD-10-CM | POA: Insufficient documentation

## 2023-10-28 LAB — CBC WITH DIFFERENTIAL (CANCER CENTER ONLY)
Abs Immature Granulocytes: 0.02 K/uL (ref 0.00–0.07)
Basophils Absolute: 0 K/uL (ref 0.0–0.1)
Basophils Relative: 1 %
Eosinophils Absolute: 0.1 K/uL (ref 0.0–0.5)
Eosinophils Relative: 1 %
HCT: 40.1 % (ref 36.0–46.0)
Hemoglobin: 13.6 g/dL (ref 12.0–15.0)
Immature Granulocytes: 0 %
Lymphocytes Relative: 26 %
Lymphs Abs: 1.3 K/uL (ref 0.7–4.0)
MCH: 28.8 pg (ref 26.0–34.0)
MCHC: 33.9 g/dL (ref 30.0–36.0)
MCV: 84.8 fL (ref 80.0–100.0)
Monocytes Absolute: 0.5 K/uL (ref 0.1–1.0)
Monocytes Relative: 10 %
Neutro Abs: 3.1 K/uL (ref 1.7–7.7)
Neutrophils Relative %: 62 %
Platelet Count: 275 K/uL (ref 150–400)
RBC: 4.73 MIL/uL (ref 3.87–5.11)
RDW: 12.2 % (ref 11.5–15.5)
WBC Count: 5.1 K/uL (ref 4.0–10.5)
nRBC: 0 % (ref 0.0–0.2)

## 2023-10-28 LAB — IRON AND TIBC
Iron: 59 ug/dL (ref 28–170)
Saturation Ratios: 14 % (ref 10.4–31.8)
TIBC: 417 ug/dL (ref 250–450)
UIBC: 358 ug/dL

## 2023-10-28 LAB — FERRITIN: Ferritin: 17 ng/mL (ref 11–307)

## 2023-10-28 NOTE — Progress Notes (Signed)
 [name], [name], [name], [name], [name] , who returns to clinic for follow-up and discussion of lab and imaging results.  She had a couple of episodes of pulsating feeling in her right calf and asked that her ultrasound be moved up.  No swelling or pain.  She has not had any recurrent clots.  Denies any GI bleeding.  She is taking an oral iron supplement.  She is concerned about why she developed a clot initially in her eyes negative risk factors.  Review of Systems  Constitutional:  Negative for chills, diaphoresis, fever, malaise/fatigue and weight loss.  HENT:  Negative for nosebleeds and sore throat.   Eyes:  Negative for double vision.  Respiratory:  Negative for cough, hemoptysis, sputum production, shortness of breath and wheezing.   Cardiovascular:  Negative for chest pain, palpitations, orthopnea and leg swelling.  Gastrointestinal:  Negative for abdominal pain, blood in stool, constipation, diarrhea, heartburn, melena, nausea and vomiting.  Genitourinary:  Negative for dysuria, frequency, hematuria and urgency.  Musculoskeletal:  Negative for back pain and joint pain.  Skin: Negative.  Negative for itching and rash.  Neurological:  Negative for dizziness, tingling, focal weakness, weakness and headaches.  Endo/Heme/Allergies:  Does not bruise/bleed easily.  Psychiatric/Behavioral:  Negative for depression. The patient is not nervous/anxious and does not have insomnia.     MEDICAL HISTORY:  Past Medical History:  Diagnosis Date    ADHD (attention deficit hyperactivity disorder), inattentive type    Allergic rhinitis due to pollen    Allergy    Asthma    Endometriosis    Skin cancer    SURGICAL HISTORY: Past Surgical History:  Procedure Laterality Date   LAPAROSCOPIC OVARIAN CYSTECTOMY     MELANOMA EXCISION  07/2017   in situ---posterior neck   OVARIAN CYST REMOVAL Right ~2007   SOCIAL HISTORY: Social History   Socioeconomic History   Marital status: Married    Spouse name: Tod   Number of children: 4   Years of education: Not on file   Highest education level: Not on file  Occupational History   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  Tobacco Use   Smoking status: Never    Passive exposure: Past   Smokeless tobacco: Never  Vaping Use   Vaping status: Never Used  Substance and Sexual Activity   Alcohol use: Yes    Comment: occ wine   Drug use: No   Sexual activity: Yes  Other Topics Concern   Not on file  Social History Narrative   Not on file   Social Drivers of Health   Financial Resource Strain: Low Risk  (02/22/2023)   Received from [facility]   Overall Financial Resource Strain (CARDIA)    Difficulty of Paying Living Expenses: Not very hard  Food Insecurity: No Food Insecurity (07/22/2023)   Hunger Vital Sign    Worried About Running Out of Food in the Last Year: Never true    Ran Out of Food in the Last Year: Never true  Transportation Needs: No  Transportation Needs (07/22/2023)   PRAPARE - Administrator, Civil Service (Medical): No    Lack of Transportation (Non-Medical): No  Physical Activity: Not on file  Stress: Not on file  Social Connections: Not on file  Intimate Partner Violence: Not At Risk (07/22/2023)   Humiliation, Afraid, Rape, and Kick questionnaire    Fear of Current or Ex-Partner: No    Emotionally Abused: No    Physically Abused: No    Sexually Abused: No   FAMILY HISTORY: Family History   Problem Relation Age of Onset   Hypertension Mother    Heart disease Mother        coronary stents   Hyperlipidemia Mother    Hypertension Father    Hyperlipidemia Father    Melanoma Father    Hypertension Sister    Cancer Maternal Grandmother        colon    Cancer Maternal Grandfather        colon   Hyperlipidemia Paternal Grandmother    Heart disease Paternal Grandfather    Stroke Paternal Grandfather    Parkinson's disease Paternal Grandfather    Diabetes Neg Hx     ALLERGIES:  has no known allergies.  MEDICATIONS:  Current Outpatient Medications  Medication Sig Dispense Refill   apixaban  (ELIQUIS ) 5 MG TABS tablet Take 1 tablet (5 mg total) by mouth 2 (two) times daily. 60 tablet 1   ferrous sulfate 324 MG TBEC Take 324 mg by mouth.     MAGNESIUM GLYCINATE PO Take by mouth.     NP THYROID 30 MG tablet Take 30 mg by mouth daily.     No current facility-administered medications for this visit.   PHYSICAL EXAMINATION: Vitals:   10/28/23 0907  BP: 99/75  Pulse: 63  Resp: 16  Temp: (!) 96.9 F (36.1 C)  SpO2: 95%   Filed Weights   10/28/23 0907  Weight: 120 lb (54.4 kg)   Physical Exam Vitals reviewed.  Constitutional:      Appearance: She is not ill-appearing.  HENT:     Head: Normocephalic and atraumatic.     Mouth/Throat:     Pharynx: Oropharynx is clear.  Eyes:     Extraocular Movements: Extraocular movements intact.     Pupils: Pupils are equal, round, and reactive to light.  Abdominal:     General: There is no distension.  Musculoskeletal:        General: No deformity.     Right lower leg: No edema.     Left lower leg: No edema.  Skin:    Coloration: Skin is not pale.  Neurological:     Mental Status: She is alert and oriented to person, place, and time.  Psychiatric:        Mood and Affect: Mood normal.        Behavior: Behavior normal.    LABORATORY DATA:  I have reviewed the data as listed Lab Results  Component Value Date   WBC  5.1 10/28/2023   HGB 13.6 10/28/2023   HCT 40.1 10/28/2023   MCV 84.8 10/28/2023   PLT 275 10/28/2023   Iron/TIBC/Ferritin/ %Sat    Component Value Date/Time   IRON 56 08/12/2023 1437   TIBC 420 08/12/2023 1437   FERRITIN 8 (L) 08/12/2023 1437   IRONPCTSAT 13 08/12/2023 1437     RADIOGRAPHIC STUDIES: I have personally reviewed the radiological images as listed and agreed with the findings in the report. No results found.   ASSESSMENT &  PLAN:   #  Acute deep vein thrombosis (DVT) of calf muscle vein of right lower extremity- July 09, 2023 [incidental-elevated D-dimer]- acute DVT of the right proximal to mid posterior tibial and peroneal veins in the tibial peroneal trunk.  Hypercoagulable workup was negative.  Etiology is unclear and she does not have any obvious provoking factors.  We discussed these again in detail today.  She questions a tick bite around that same time and review of the literature I am unable to identify any direct association with a hypercoagulable state and tick bites.  There is previously no report of May Thurner syndrome or other structural changes that may have increased risk of DVT.  She has now completed 3 months of anticoagulation.  Her repeat ultrasound on 09/14/2023 was negative for right lower extremity DVT.  I think it is reasonable to stop her anticoagulation.  We discussed that she should minimize any modifiable risk factors and discussed those today to help avoid a second clot.  Should she develop a second clot she would likely need lifelong anticoagulation.  We also discussed that she may experience chronic post DVT symptoms lifelong.  # Decreased iron levels-she was previously found to have a slight dip in her hemoglobin and thought to be likely GI, Dr. Kristie.  Hemoglobin today has risen and is now 13.6.  Ferritin and iron studies are pending but in August her ferritin was 8, iron sat 13%.  She has started oral iron.  Tolerating gentle iron well without  significant side effects.  Plan to continue.  We will plan to recheck her counts in roughly 3 months.   # Plan for toe surgery elective-hold off but on anticoagulation but will consider down the line.   # DISPOSITION: 3-4 months- lab (cbc, cmp, ferritin, iron studies) Day to week later- see Dr Rennie- la   No problem-specific Assessment & Plan notes found for this encounter.  Maureen KANDICE Dawn, NP 10/28/2023

## 2023-10-30 NOTE — Progress Notes (Unsigned)
 CARDIOLOGY CONSULT NOTE       Patient ID: Maureen Velazquez MRN: [id number] DOB/AGE: 1975/07/02 48 y.o.  Admit date: (Not on file) Referring Physician: [name] Primary Physician: Trudy Dorn BRAVO, MD Primary Cardiologist: Delford Reason for Consultation:DVT     HPI:  48 y.o. referred by Dr Letvak for unprovoked DVT. She is the wife of my patient Maureen Velazquez. Diagnosed with DVT 6/28 and back in ER 3 days latter. CTA  6/28 negative for PE. CXR NAD Seen in ED 07/12/23 for 3 days chest tightness. Worse with deep breathing Not exertional associated with dyspnea Has had multiple recent tick bites. ? Rash at some point 3 days prior to this she was started on eliquis  for her DVT and doxycycline  Lyme serology negative Troponin and BNP normal WBC supressed 2.9 Respiratory panel negative including influenza RSV and COVID   Review of her PE study showed normal coronary origins and minimal calcium in ostium of RCA/LAD. No aortic pathology  Prior to this she is very active and works out daily Does spin classes and aerobics  Further w/u showed normal ETT 08/18/23 Normal echo 08/26/23 no effusion  F/U duplex 09/14/23 no right sided DVT resolved  F/U venous duplex 09/14/23 negative for right DVT  Seen by Tinnie Dawn NP with hematology 10/28/23 Hypercoagulable w/u negative Eliquis  d/c    ROS All other systems reviewed and negative except as noted above  Past Medical History:  Diagnosis Date   Acute anal fissure 10/28/2023   ADHD (attention deficit hyperactivity disorder), inattentive type    Allergic rhinitis due to pollen    Allergy    Asthma    Endometriosis    Skin cancer     Family History  Problem Relation Age of Onset   Hypertension Mother    Heart disease Mother        coronary stents   Hyperlipidemia Mother    Hypertension Father    Hyperlipidemia Father    Melanoma Father    Hypertension Sister    Cancer Maternal Grandmother        colon    Cancer Maternal Grandfather        colon    Hyperlipidemia Paternal Grandmother    Heart disease Paternal Grandfather    Stroke Paternal Grandfather    Parkinson's disease Paternal Grandfather    Diabetes Neg Hx     Social History   Socioeconomic History   Marital status: Married    Spouse name: Maureen Velazquez   Number of children: 4   Years of education: Not on file   Highest education level: Not on file  Occupational History   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  Tobacco Use   Smoking status: Never    Passive exposure: Past   Smokeless tobacco: Never  Vaping Use   Vaping status: Never Used  Substance and Sexual Activity   Alcohol use: Yes    Comment: occ wine   Drug use: No   Sexual activity: Yes  Other Topics Concern   Not on file  Social History Narrative   Not on file   Social Drivers of Health   Financial Resource Strain: Low Risk  (02/22/2023)   Received from [facility]   Overall Financial Resource Strain (CARDIA)    Difficulty of Paying Living Expenses: Not very hard  Food Insecurity: No Food Insecurity (07/22/2023)   Hunger Vital Sign    Worried About Running Out of Food in the Last Year: Never  true    Ran Out of Food in the Last Year: Never true  Transportation Needs: No Transportation Needs (07/22/2023)   PRAPARE - Administrator, Civil Service (Medical): No    Lack of Transportation (Non-Medical): No  Physical Activity: Not on file  Stress: Not on file  Social Connections: Not on file  Intimate Partner Violence: Not At Risk (07/22/2023)   Humiliation, Afraid, Rape, and Kick questionnaire    Fear of Current or Ex-Partner: No    Emotionally Abused: No    Physically Abused: No    Sexually Abused: No    Past Surgical History:  Procedure Laterality Date   LAPAROSCOPIC OVARIAN CYSTECTOMY     MELANOMA EXCISION  07/2017   in situ---posterior neck   OVARIAN CYST REMOVAL Right ~2007      Current Outpatient Medications:    ferrous sulfate  324 MG TBEC, Take 324 mg by mouth., Disp: , Rfl:    MAGNESIUM GLYCINATE PO, Take by mouth., Disp: , Rfl:    NP THYROID 30 MG tablet, Take 30 mg by mouth daily., Disp: , Rfl:     Physical Exam: There were no vitals taken for this visit.    Affect appropriate Healthy:  appears stated age HEENT: normal Neck supple with no adenopathy JVP normal no bruits no thyromegaly Lungs clear with no wheezing and good diaphragmatic motion Heart:  S1/S2 no murmur, no rub, gallop or click PMI normal Abdomen: benighn, BS positve, no tenderness, no AAA no bruit.  No HSM or HJR Distal pulses intact with no bruits No edema Neuro non-focal Skin warm and dry No muscular weakness   Labs:   Lab Results  Component Value Date   WBC 5.1 10/28/2023   HGB 13.6 10/28/2023   HCT 40.1 10/28/2023   MCV 84.8 10/28/2023   PLT 275 10/28/2023       Radiology: No results found.   EKG: SR rate 81 normal    ASSESSMENT AND PLAN:   Chest pain: atypical seems to be related to ? Viral bronchial problem Despite DVT PE study negative no parenchymal lung dx, Resp panel negative, BNP normal and ECG normal no signs of pericarditis. Will order echo and ESR/CRP. Given recent CT scans normal ECG and atypical pain would not repeat CT cardiac or nuclear study  ETT normal 08/18/23  ESR/CRP normal 07/20/23 Normal echo 08/26/23  Tick Bite:  finishing course of doxycycline  Lyme titer negative Could have other rickettsial dx f/u primary spotted fever also negative  DVT:  repeat duplex negative seen by hematology hypercoagulable w/u negative eliquis  d/c  Anemia :  iron deficient improved on supplement     F/U PRN    Signed: Maude Velazquez 10/30/2023, 4:54 PM

## 2023-11-04 ENCOUNTER — Encounter: Payer: Self-pay | Admitting: Cardiovascular Disease

## 2023-11-04 ENCOUNTER — Ambulatory Visit: Attending: Cardiovascular Disease | Admitting: Cardiovascular Disease

## 2023-11-04 VITALS — BP 110/80 | HR 76 | Resp 17 | Ht 60.0 in | Wt 120.0 lb

## 2023-11-04 DIAGNOSIS — I82451 Acute embolism and thrombosis of right peroneal vein: Secondary | ICD-10-CM

## 2023-11-04 DIAGNOSIS — R079 Chest pain, unspecified: Secondary | ICD-10-CM | POA: Diagnosis not present

## 2023-11-04 NOTE — Patient Instructions (Addendum)
 Medication Instructions:  Your physician recommends that you continue on your current medications as directed. Please refer to the Current Medication list given to you today.  *If you need a refill on your cardiac medications before your next appointment, please call your pharmacy*  Lab Work: none If you have labs (blood work) drawn today and your tests are completely normal, you will receive your results only by: MyChart Message (if you have MyChart) OR A paper copy in the mail If you have any lab test that is abnormal or we need to change your treatment, we will call you to review the results.  Testing/Procedures:   Follow-Up: At [facility], you and your health needs are our priority.  As part of our continuing mission to provide you with exceptional heart care, our providers are all part of one team.  This team includes your primary Cardiologist (physician) and Advanced Practice Providers or APPs (Physician Assistants and Nurse Practitioners) who all work together to provide you with the care you need, when you need it.  Your next appointment:   As needed  Provider:   Dr. Delford  We recommend signing up for the patient portal called MyChart.  Sign up information is provided on this After Visit Summary.  MyChart is used to connect with patients for Virtual Visits (Telemedicine).  Patients are able to view lab/test results, encounter notes, upcoming appointments, etc.  Non-urgent messages can be sent to your provider as well.   To learn more about what you can do with MyChart, go to [email or web address].   Other Instructions none

## 2023-11-26 ENCOUNTER — Other Ambulatory Visit: Payer: Self-pay | Admitting: Internal Medicine

## 2023-12-06 ENCOUNTER — Encounter: Payer: Self-pay | Admitting: Podiatry

## 2023-12-06 ENCOUNTER — Ambulatory Visit: Admitting: Podiatry

## 2023-12-06 VITALS — Ht 60.0 in | Wt 120.0 lb

## 2023-12-06 DIAGNOSIS — M2021 Hallux rigidus, right foot: Secondary | ICD-10-CM

## 2023-12-06 NOTE — Progress Notes (Signed)
 Chief Complaint  Patient presents with   Toe Pain    Pt is here to discuss surgery to the right great toe, had surgery set before had to cancel due to another issue.    HPI: 48 y.o. female healthy very active presenting today for follow-up evaluation of chronic right great toe pain.   Brief history: No history of injury.  Onset about 3 years ago which has progressively gotten worse.  She has been seen by orthopedics and they have provided her with cortisone injections which only helped temporarily.  She has also tried shoe gear modifications and oral anti-inflammatories with minimal relief.  Past Medical History:  Diagnosis Date   Acute anal fissure 10/28/2023   ADHD (attention deficit hyperactivity disorder), inattentive type    Allergic rhinitis due to pollen    Allergy    Asthma    Endometriosis    Skin cancer     Past Surgical History:  Procedure Laterality Date   LAPAROSCOPIC OVARIAN CYSTECTOMY     MELANOMA EXCISION  07/2017   in situ---posterior neck   OVARIAN CYST REMOVAL Right ~2007    No Known Allergies   Physical Exam: General: The patient is alert and oriented x3 in no acute distress.  Dermatology: Skin is warm, dry and supple bilateral lower extremities.   Vascular: Palpable pedal pulses bilaterally. Capillary refill within normal limits.  No appreciable edema.  No erythema.  Neurological: Grossly intact via light touch  Musculoskeletal Exam: Enlargement of the first MTP noted on clinical exam with limited range of motion and crepitus as well.  There is also pain with palpation and range of motion of the first MTP.  Radiographic Exam RT foot 06/22/2023:  Joint space narrowing with para-articular spurring noted to the first MTP of the right foot consistent with hallux rigidus/arthritis  Assessment/Plan of Care: 1.  Hallux rigidus/arthritis right great toe  -Patient evaluated.  X-rays reviewed again today -Today again we discussed and great length in  detail conservative versus surgical management of the arthritis to the right great toe joint with periarticular spurring.  Conservatively recommend good supportive shoes that are wide fitting and do not constrict the toebox area.  Also recommend a rocker type bottom shoe that allows you to rock through the motion of the forefoot, essentially limited to the motion of the first MTP. -Surgically we discussed in length in detail cheilectomy vs. first MTP arthrodesis/fusion vs. first MTP arthroplasty with implant/joint replacement.  Each surgical procedure was explained in detail including the risks and benefits of each.  Ultimately I do believe the patient would benefit from right great toe arthroplasty with implant to maintain motion to the first MTP.  She is very healthy and active and maintaining motion to the great toe joint is a high priority postoperatively.  -Risk benefits advantages and disadvantages of the procedure were explained in detail to the patient.  No guarantees were expressed or implied.  All patient questions were answered.  After discussing with the patient she would like to proceed with surgery -Authorization for surgery was reinitiated today.  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 Thresa EMERSON Sar, DPM    [address] [place], [place] [address]                Office [phone]  Fax [phone]

## 2023-12-19 ENCOUNTER — Telehealth: Payer: Self-pay | Admitting: Podiatry

## 2023-12-19 NOTE — Telephone Encounter (Signed)
 Patient called to schedule surgery for 01/13/2024. States she was told by provider that he would be able to accommodate this date. Confirmed with provider. Patient not on any GLP1 or blood thinners. Patient pharmacy correct in chart.

## 2023-12-26 ENCOUNTER — Emergency Department

## 2023-12-26 ENCOUNTER — Encounter: Payer: Self-pay | Admitting: Emergency Medicine

## 2023-12-26 ENCOUNTER — Other Ambulatory Visit: Payer: Self-pay

## 2023-12-26 ENCOUNTER — Emergency Department
Admission: EM | Admit: 2023-12-26 | Discharge: 2023-12-26 | Disposition: A | Discharge status: Home / Self Care | Attending: Emergency Medicine | Admitting: Emergency Medicine

## 2023-12-26 DIAGNOSIS — T07XXXA Unspecified multiple injuries, initial encounter: Secondary | ICD-10-CM

## 2023-12-26 DIAGNOSIS — S0990XA Unspecified injury of head, initial encounter: Secondary | ICD-10-CM | POA: Diagnosis present

## 2023-12-26 DIAGNOSIS — M542 Cervicalgia: Secondary | ICD-10-CM | POA: Diagnosis not present

## 2023-12-26 DIAGNOSIS — R55 Syncope and collapse: Secondary | ICD-10-CM

## 2023-12-26 DIAGNOSIS — R791 Abnormal coagulation profile: Secondary | ICD-10-CM | POA: Diagnosis not present

## 2023-12-26 DIAGNOSIS — J45909 Unspecified asthma, uncomplicated: Secondary | ICD-10-CM | POA: Diagnosis not present

## 2023-12-26 DIAGNOSIS — S0083XA Contusion of other part of head, initial encounter: Secondary | ICD-10-CM | POA: Diagnosis not present

## 2023-12-26 DIAGNOSIS — Y92481 Parking lot as the place of occurrence of the external cause: Secondary | ICD-10-CM | POA: Diagnosis not present

## 2023-12-26 DIAGNOSIS — S0031XA Abrasion of nose, initial encounter: Secondary | ICD-10-CM | POA: Diagnosis not present

## 2023-12-26 LAB — COMPREHENSIVE METABOLIC PANEL WITH GFR
ALT: 42 U/L (ref 0–44)
AST: 62 U/L — ABNORMAL HIGH (ref 15–41)
Albumin: 4.3 g/dL (ref 3.5–5.0)
Alkaline Phosphatase: 58 U/L (ref 38–126)
Anion gap: 9 (ref 5–15)
BUN: 20 mg/dL (ref 6–20)
CO2: 25 mmol/L (ref 22–32)
Calcium: 9.2 mg/dL (ref 8.9–10.3)
Chloride: 104 mmol/L (ref 98–111)
Creatinine, Ser: 0.78 mg/dL (ref 0.44–1.00)
GFR, Estimated: >60 mL/min (ref >60)
Glucose, Bld: 93 mg/dL (ref 70–99)
Potassium: 3.7 mmol/L (ref 3.5–5.1)
Sodium: 138 mmol/L (ref 135–145)
Total Bilirubin: 0.5 mg/dL (ref 0.0–1.2)
Total Protein: 7 g/dL (ref 6.5–8.1)

## 2023-12-26 LAB — CBC WITH DIFFERENTIAL/PLATELET
Abs Immature Granulocytes: 0.02 K/uL (ref 0.00–0.07)
Basophils Absolute: 0 K/uL (ref 0.0–0.1)
Basophils Relative: 0 %
Eosinophils Absolute: 0.1 K/uL (ref 0.0–0.5)
Eosinophils Relative: 1 %
HCT: 39.1 % (ref 36.0–46.0)
Hemoglobin: 12.9 g/dL (ref 12.0–15.0)
Immature Granulocytes: 0 %
Lymphocytes Relative: 14 %
Lymphs Abs: 1.1 K/uL (ref 0.7–4.0)
MCH: 28.5 pg (ref 26.0–34.0)
MCHC: 33 g/dL (ref 30.0–36.0)
MCV: 86.5 fL (ref 80.0–100.0)
Monocytes Absolute: 0.5 K/uL (ref 0.1–1.0)
Monocytes Relative: 7 %
Neutro Abs: 5.9 K/uL (ref 1.7–7.7)
Neutrophils Relative %: 78 %
Platelets: 257 K/uL (ref 150–400)
RBC: 4.52 MIL/uL (ref 3.87–5.11)
RDW: 12.9 % (ref 11.5–15.5)
WBC: 7.6 K/uL (ref 4.0–10.5)
nRBC: 0 % (ref 0.0–0.2)

## 2023-12-26 LAB — URINALYSIS, ROUTINE W REFLEX MICROSCOPIC
Bacteria, UA: NONE SEEN
Bilirubin Urine: NEGATIVE
Glucose, UA: NEGATIVE mg/dL
Ketones, ur: NEGATIVE mg/dL
Leukocytes,Ua: NEGATIVE
Nitrite: NEGATIVE
Protein, ur: NEGATIVE mg/dL
RBC / HPF: 0 RBC/hpf (ref 0–5)
Specific Gravity, Urine: 1.003 — ABNORMAL LOW (ref 1.005–1.030)
Squamous Epithelial / HPF: 0 /HPF (ref 0–5)
pH: 7 (ref 5.0–8.0)

## 2023-12-26 LAB — D-DIMER, QUANTITATIVE: D-Dimer, Quant: 1.08 ug{FEU}/mL — ABNORMAL HIGH (ref 0.00–0.50)

## 2023-12-26 LAB — URINE DRUG SCREEN
Amphetamines: NEGATIVE
Barbiturates: NEGATIVE
Benzodiazepines: NEGATIVE
Cocaine: NEGATIVE
Fentanyl: NEGATIVE
Methadone Scn, Ur: NEGATIVE
Opiates: NEGATIVE
Tetrahydrocannabinol: NEGATIVE

## 2023-12-26 LAB — TROPONIN T, HIGH SENSITIVITY: Troponin T High Sensitivity: <15 ng/L (ref 0–19)

## 2023-12-26 LAB — POC URINE PREG, ED: Preg Test, Ur: NEGATIVE

## 2023-12-26 MED ORDER — IOHEXOL 350 MG/ML SOLN
75.0000 mL | Freq: Once | INTRAVENOUS | Status: AC | PRN
  Administered 2023-12-26: 15:00:00 75 mL via INTRAVENOUS

## 2023-12-26 MED ORDER — BACLOFEN 10 MG PO TABS: 10.0000 mg | ORAL_TABLET | Freq: Three times a day (TID) | ORAL | 0 refills | Status: AC

## 2023-12-26 NOTE — Discharge Instructions (Signed)
 Follow-up with your regular doctor.  Follow-up with neurology.  Take the muscle relaxer if needed.  Tylenol  and ibuprofen for pain if needed
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
## 2023-12-27 ENCOUNTER — Telehealth: Payer: Self-pay | Admitting: *Deleted

## 2023-12-27 ENCOUNTER — Encounter: Payer: Self-pay | Admitting: Podiatry
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
## 2023-12-28 NOTE — Telephone Encounter (Signed)
 Patient has a neurology apt at 1115 today. She has elected to come tomorrow and prefers to see Lauren, whom she has seen in the past. She can now come at 930 am tom. Msg sent to scheduling to add pt for follow-up with Lauren.

## 2023-12-29 ENCOUNTER — Encounter: Payer: Self-pay | Admitting: Nurse Practitioner

## 2023-12-29 ENCOUNTER — Inpatient Hospital Stay: Attending: Internal Medicine | Admitting: Nurse Practitioner

## 2023-12-29 VITALS — BP 111/68 | HR 63 | Temp 97.8°F | Resp 16 | Wt 117.0 lb

## 2023-12-29 DIAGNOSIS — Z86718 Personal history of other venous thrombosis and embolism: Secondary | ICD-10-CM | POA: Insufficient documentation

## 2023-12-29 DIAGNOSIS — D649 Anemia, unspecified: Secondary | ICD-10-CM | POA: Insufficient documentation

## 2023-12-29 DIAGNOSIS — Z7189 Other specified counseling: Secondary | ICD-10-CM
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
## 2023-12-30 ENCOUNTER — Telehealth: Payer: Self-pay | Admitting: Podiatry

## 2023-12-30 NOTE — Telephone Encounter (Signed)
 Patient called and cancelled surgery set for January due to health issues she is having.

## 2024-01-20 ENCOUNTER — Encounter: Admitting: Podiatry

## 2024-01-20 ENCOUNTER — Inpatient Hospital Stay

## 2024-01-26 ENCOUNTER — Ambulatory Visit: Attending: Internal Medicine | Admitting: Internal Medicine

## 2024-01-26 ENCOUNTER — Encounter: Payer: Self-pay | Admitting: Internal Medicine

## 2024-01-26 VITALS — BP 104/72 | HR 72 | Temp 97.1°F | Resp 16 | Ht 61.0 in | Wt 121.0 lb

## 2024-01-26 DIAGNOSIS — D509 Iron deficiency anemia, unspecified: Secondary | ICD-10-CM | POA: Insufficient documentation

## 2024-01-26 DIAGNOSIS — I824Z1 Acute embolism and thrombosis of unspecified deep veins of right distal lower extremity: Secondary | ICD-10-CM | POA: Insufficient documentation

## 2024-01-26 DIAGNOSIS — E039 Hypothyroidism, unspecified: Secondary | ICD-10-CM

## 2024-01-26 DIAGNOSIS — I7300 Raynaud's syndrome without gangrene: Secondary | ICD-10-CM

## 2024-01-26 DIAGNOSIS — M2550 Pain in unspecified joint: Secondary | ICD-10-CM

## 2024-01-26 NOTE — Assessment & Plan Note (Addendum)
 Checking TPO Abs Orders:   Thyroid  Peroxidase Antibodies (TPO) (REFL)
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
## 2024-01-26 NOTE — Assessment & Plan Note (Addendum)
 Mild Raynaud's phenomenon, symptoms worse in cold, no structural blood vessel abnormalities. Reported symptoms not severe enough to recommend additional maintenance treatment and BP is in normal range. - Advised keeping hands warm and avoiding/limiting stimulants.

## 2024-01-26 NOTE — Assessment & Plan Note (Addendum)
 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 Symptoms may be linked to perimenopausal changes. ROM is quite good and no heberdon's nodes or other structural change appreciable. Inflammatory arthritis has not been indicated by current tests. - Ordered blood tests for inflammatory arthritis, including ANA-related antibodies and complement tests. - Provided information on chronic pain and fatigue management. - Advised as-needed use of NSAIDs if symptoms worsen. Orders:   C3 and C4   Rheumatoid factor   Sedimentation rate   ANA Screen,IFA,Reflex Titer/Pattern,Reflex Mplx 11 Ab Cascade with IdentRA

## 2024-01-26 NOTE — Patient Instructions (Signed)
 I recommend checking out the [facility]  patient-centered guide for fibromyalgia and chronic pain management: [email or web address]         Supplements for Osteoarthritis  Natural anti-inflammatories can help reduce inflammation and joint stiffness without some of the harmful side effects of non-steroidal anti-inflammatories (Advil, Motrin, Aleve, etc)  Recommend starting with one supplement and give a 3-4 week trial period before adding another  You may be able to find some of these products at your local pharmacy, but may also purchase at [employer], other specialty stores, or online   Turmeric Recommended dose 400mg  once a day May increase to twice a day if tolerated (may cause stomach upset) Do not take if you are on a blood thinner, and stop prior to surgery   Ginger (root or capsules) Recommended dose is 2 grams twice daily, or 2 cups of tea daily Do not take if you are on a blood thinner, and stop prior to surgery   Fish Oil or Omega 3 Recommended dose for capsule is 2 grams twice daily (make sure it contains at least 30% of EPA/DHA) For food, two 3-ounce servings of fish a week, or flaxseed, chia seeds, walnuts, and almonds   Tart Cherry (dried, extract, or tablets) Recommended dose is 500mg  once a day   *Although these are natural products, they can still interact with medications. Always consult with your doctor or pharmacist when starting new supplements and/or medications*  *Patents should be under the care of a physician or other medical provide while taking these supplements*

## 2024-01-27 ENCOUNTER — Inpatient Hospital Stay

## 2024-01-27 ENCOUNTER — Inpatient Hospital Stay: Admitting: Nurse Practitioner

## 2024-01-31 ENCOUNTER — Encounter: Admitting: Podiatry

## 2024-01-31 LAB — ANA SCREEN,IFA,REFLEX TITER/PATTERN,REFLEX MPLX 11 AB CASCADE
Anti Nuclear Antibody (ANA): NEGATIVE
Cyclic Citrullin Peptide Ab: 17 U
MUTATED CITRULLINATED VIMENTIN (MCV) AB: <20 U/mL
Rheumatoid fact SerPl-aCnc: <10 [IU]/mL

## 2024-01-31 LAB — C3 AND C4
C3 Complement: 89 mg/dL (ref 83–193)
C4 Complement: 22 mg/dL (ref 15–57)

## 2024-01-31 LAB — THYROID PEROXIDASE ANTIBODIES (TPO) (REFL): Thyroperoxidase Ab SerPl-aCnc: 1 [IU]/mL

## 2024-01-31 LAB — SEDIMENTATION RATE: Sed Rate: 6 mm/h (ref 0–20)

## 2024-02-14 ENCOUNTER — Encounter: Admitting: Podiatry

## 2024-02-17 ENCOUNTER — Inpatient Hospital Stay: Attending: Internal Medicine

## 2024-02-17 DIAGNOSIS — I82461 Acute embolism and thrombosis of right calf muscular vein: Secondary | ICD-10-CM

## 2024-02-17 LAB — CBC WITH DIFFERENTIAL (CANCER CENTER ONLY)
Abs Immature Granulocytes: 0.01 10*3/uL (ref 0.00–0.07)
Basophils Absolute: 0 10*3/uL (ref 0.0–0.1)
Basophils Relative: 1 %
Eosinophils Absolute: 0.1 10*3/uL (ref 0.0–0.5)
Eosinophils Relative: 2 %
HCT: 40.9 % (ref 36.0–46.0)
Hemoglobin: 13.5 g/dL (ref 12.0–15.0)
Immature Granulocytes: 0 %
Lymphocytes Relative: 26 %
Lymphs Abs: 1.4 10*3/uL (ref 0.7–4.0)
MCH: 28.7 pg (ref 26.0–34.0)
MCHC: 33 g/dL (ref 30.0–36.0)
MCV: 87 fL (ref 80.0–100.0)
Monocytes Absolute: 0.6 10*3/uL (ref 0.1–1.0)
Monocytes Relative: 12 %
Neutro Abs: 3.3 10*3/uL (ref 1.7–7.7)
Neutrophils Relative %: 59 %
Platelet Count: 238 10*3/uL (ref 150–400)
RBC: 4.7 MIL/uL (ref 3.87–5.11)
RDW: 13 % (ref 11.5–15.5)
WBC Count: 5.5 10*3/uL (ref 4.0–10.5)
nRBC: 0 % (ref 0.0–0.2)

## 2024-02-17 LAB — FERRITIN: Ferritin: 31 ng/mL (ref 11–307)

## 2024-02-17 LAB — IRON AND TIBC
Iron: 61 ug/dL (ref 28–170)
Saturation Ratios: 15 % (ref 10.4–31.8)
TIBC: 420 ug/dL (ref 250–450)
UIBC: 359 ug/dL

## 2024-02-21 ENCOUNTER — Inpatient Hospital Stay: Admitting: Nurse Practitioner

## 2024-02-24 ENCOUNTER — Inpatient Hospital Stay: Admitting: Nurse Practitioner
